# Patient Record
Sex: Female | Born: 1965 | Race: White | Hispanic: No | Marital: Married | Smoking: Never smoker
Health system: Southern US, Community
[De-identification: ages and names within clinical notes are randomized; demographics above are authoritative.]

## PROBLEM LIST (undated history)

## (undated) DIAGNOSIS — E559 Vitamin D deficiency, unspecified: Secondary | ICD-10-CM

## (undated) DIAGNOSIS — M1990 Unspecified osteoarthritis, unspecified site: Secondary | ICD-10-CM

## (undated) HISTORY — DX: Vitamin D deficiency, unspecified: E55.9

## (undated) HISTORY — DX: Unspecified osteoarthritis, unspecified site: M19.90

## (undated) HISTORY — PX: NO PAST SURGERIES: SHX2092

## (undated) NOTE — *Deleted (*Deleted)
Preventive Care 40-64 Years Old, Female Preventive care refers to visits with your health care provider and lifestyle choices that can promote health and wellness. This includes:  A yearly physical exam. This may also be called an annual well check.  Regular dental visits and eye exams.  Immunizations.  Screening for certain conditions.  Healthy lifestyle choices, such as eating a healthy diet, getting regular exercise, not using drugs or products that contain nicotine and tobacco, and limiting alcohol use. What can I expect for my preventive care visit? Physical exam Your health care provider will check your:  Height and weight. This may be used to calculate body mass index (BMI), which tells if you are at a healthy weight.  Heart rate and blood pressure.  Skin for abnormal spots. Counseling Your health care provider may ask you questions about your:  Alcohol, tobacco, and drug use.  Emotional well-being.  Home and relationship well-being.  Sexual activity.  Eating habits.  Work and work environment.  Method of birth control.  Menstrual cycle.  Pregnancy history. What immunizations do I need?  Influenza (flu) vaccine  This is recommended every year. Tetanus, diphtheria, and pertussis (Tdap) vaccine  You may need a Td booster every 10 years. Varicella (chickenpox) vaccine  You may need this if you have not been vaccinated. Zoster (shingles) vaccine  You may need this after age 60. Measles, mumps, and rubella (MMR) vaccine  You may need at least one dose of MMR if you were born in 1957 or later. You may also need a second dose. Pneumococcal conjugate (PCV13) vaccine  You may need this if you have certain conditions and were not previously vaccinated. Pneumococcal polysaccharide (PPSV23) vaccine  You may need one or two doses if you smoke cigarettes or if you have certain conditions. Meningococcal conjugate (MenACWY) vaccine  You may need this if you  have certain conditions. Hepatitis A vaccine  You may need this if you have certain conditions or if you travel or work in places where you may be exposed to hepatitis A. Hepatitis B vaccine  You may need this if you have certain conditions or if you travel or work in places where you may be exposed to hepatitis B. Haemophilus influenzae type b (Hib) vaccine  You may need this if you have certain conditions. Human papillomavirus (HPV) vaccine  If recommended by your health care provider, you may need three doses over 6 months. You may receive vaccines as individual doses or as more than one vaccine together in one shot (combination vaccines). Talk with your health care provider about the risks and benefits of combination vaccines. What tests do I need? Blood tests  Lipid and cholesterol levels. These may be checked every 5 years, or more frequently if you are over 50 years old.  Hepatitis C test.  Hepatitis B test. Screening  Lung cancer screening. You may have this screening every year starting at age 55 if you have a 30-pack-year history of smoking and currently smoke or have quit within the past 15 years.  Colorectal cancer screening. All adults should have this screening starting at age 50 and continuing until age 75. Your health care provider may recommend screening at age 45 if you are at increased risk. You will have tests every 1-10 years, depending on your results and the type of screening test.  Diabetes screening. This is done by checking your blood sugar (glucose) after you have not eaten for a while (fasting). You may have this   done every 1-3 years.  Mammogram. This may be done every 1-2 years. Talk with your health care provider about when you should start having regular mammograms. This may depend on whether you have a family history of breast cancer.  BRCA-related cancer screening. This may be done if you have a family history of breast, ovarian, tubal, or peritoneal  cancers.  Pelvic exam and Pap test. This may be done every 3 years starting at age 21. Starting at age 30, this may be done every 5 years if you have a Pap test in combination with an HPV test. Other tests  Sexually transmitted disease (STD) testing.  Bone density scan. This is done to screen for osteoporosis. You may have this scan if you are at high risk for osteoporosis. Follow these instructions at home: Eating and drinking  Eat a diet that includes fresh fruits and vegetables, whole grains, lean protein, and low-fat dairy.  Take vitamin and mineral supplements as recommended by your health care provider.  Do not drink alcohol if: ? Your health care provider tells you not to drink. ? You are pregnant, may be pregnant, or are planning to become pregnant.  If you drink alcohol: ? Limit how much you have to 0-1 drink a day. ? Be aware of how much alcohol is in your drink. In the [place] one drink equals one 12 oz bottle of beer (355 mL), one 5 oz glass of wine (148 mL), or one 1 oz glass of hard liquor (44 mL). Lifestyle  Take daily care of your teeth and gums.  Stay active. Exercise for at least 30 minutes on 5 or more days each week.  Do not use any products that contain nicotine or tobacco, such as cigarettes, e-cigarettes, and chewing tobacco. If you need help quitting, ask your health care provider.  If you are sexually active, practice safe sex. Use a condom or other form of birth control (contraception) in order to prevent pregnancy and STIs (sexually transmitted infections).  If told by your health care provider, take low-dose aspirin daily starting at age 50. What's next?  Visit your health care provider once a year for a well check visit.  Ask your health care provider how often you should have your eyes and teeth checked.  Stay up to date on all vaccines. This information is not intended to replace advice given to you by your health care provider. Make sure you  discuss any questions you have with your health care provider. Document Revised: 12/05/2017 Document Reviewed: 12/05/2017 Elsevier Patient Education  2020 Elsevier Inc.  

---

## 1998-07-19 ENCOUNTER — Other Ambulatory Visit: Admission: RE | Admit: 1998-07-19 | Discharge: 1998-07-19 | Payer: Self-pay | Admitting: Obstetrics and Gynecology

## 2002-02-11 ENCOUNTER — Inpatient Hospital Stay (HOSPITAL_COMMUNITY): Admission: AD | Admit: 2002-02-11 | Discharge: 2002-02-14 | Payer: Self-pay | Admitting: Obstetrics and Gynecology

## 2006-07-30 ENCOUNTER — Ambulatory Visit: Payer: Self-pay | Admitting: Obstetrics and Gynecology

## 2009-01-19 DIAGNOSIS — R319 Hematuria, unspecified: Secondary | ICD-10-CM | POA: Insufficient documentation

## 2009-05-12 DIAGNOSIS — R42 Dizziness and giddiness: Secondary | ICD-10-CM | POA: Insufficient documentation

## 2009-12-09 ENCOUNTER — Ambulatory Visit: Payer: Self-pay | Admitting: Obstetrics and Gynecology

## 2010-01-23 ENCOUNTER — Ambulatory Visit: Payer: Self-pay | Admitting: Specialist

## 2011-01-29 ENCOUNTER — Ambulatory Visit: Payer: Self-pay | Admitting: Obstetrics and Gynecology

## 2012-02-05 ENCOUNTER — Ambulatory Visit: Payer: Self-pay | Admitting: Obstetrics and Gynecology

## 2013-03-23 ENCOUNTER — Ambulatory Visit: Payer: Self-pay | Admitting: Obstetrics and Gynecology

## 2014-07-19 ENCOUNTER — Emergency Department: Admit: 2014-07-19 | Disposition: A | Discharge status: Home / Self Care | Payer: Self-pay | Admitting: Student

## 2018-05-07 DIAGNOSIS — R768 Other specified abnormal immunological findings in serum: Secondary | ICD-10-CM | POA: Insufficient documentation

## 2018-05-07 DIAGNOSIS — M47816 Spondylosis without myelopathy or radiculopathy, lumbar region: Secondary | ICD-10-CM | POA: Insufficient documentation

## 2019-01-15 DIAGNOSIS — M545 Low back pain, unspecified: Secondary | ICD-10-CM | POA: Insufficient documentation

## 2019-02-12 LAB — VITAMIN D 25 HYDROXY (VIT D DEFICIENCY, FRACTURES): Vit D, 25-Hydroxy: 23

## 2019-02-12 LAB — CBC AND DIFFERENTIAL
HCT: 41 (ref 36–46)
Hemoglobin: 13.6 (ref 12.0–16.0)
Neutrophils Absolute: 2773
Platelets: 394 (ref 150–399)
WBC: 5.9

## 2019-02-12 LAB — LIPID PANEL
Cholesterol: 266 — AB (ref 0–200)
HDL: 74 — AB (ref 35–70)
LDL Cholesterol: 176
LDl/HDL Ratio: 3.6
Triglycerides: 56 (ref 40–160)

## 2019-02-12 LAB — IRON,TIBC AND FERRITIN PANEL
Ferritin: 118
Iron: 99
TIBC: 367

## 2019-02-12 LAB — HEPATIC FUNCTION PANEL
ALT: 26 (ref 7–35)
AST: 20 (ref 13–35)
Alkaline Phosphatase: 66 (ref 25–125)
Bilirubin, Direct: 0.1 (ref 0.01–0.4)
Bilirubin, Total: 0.5

## 2019-02-12 LAB — CBC: RBC: 4.53 (ref 3.87–5.11)

## 2019-02-12 LAB — TSH: TSH: 1.37 (ref 0.41–5.90)

## 2019-02-12 LAB — HEMOGLOBIN A1C: Hemoglobin A1C: 5.4

## 2019-02-12 LAB — BASIC METABOLIC PANEL: Glucose: 92

## 2019-02-12 LAB — COMPREHENSIVE METABOLIC PANEL: Calcium: 10.2 (ref 8.7–10.7)

## 2019-03-11 DIAGNOSIS — M2550 Pain in unspecified joint: Secondary | ICD-10-CM | POA: Insufficient documentation

## 2019-04-17 DIAGNOSIS — G894 Chronic pain syndrome: Secondary | ICD-10-CM | POA: Insufficient documentation

## 2019-08-08 ENCOUNTER — Encounter: Payer: Self-pay | Admitting: Emergency Medicine

## 2019-08-08 ENCOUNTER — Other Ambulatory Visit: Payer: Self-pay

## 2019-08-08 ENCOUNTER — Emergency Department: Payer: 59

## 2019-08-08 DIAGNOSIS — Y999 Unspecified external cause status: Secondary | ICD-10-CM | POA: Insufficient documentation

## 2019-08-08 DIAGNOSIS — W500XXA Accidental hit or strike by another person, initial encounter: Secondary | ICD-10-CM | POA: Insufficient documentation

## 2019-08-08 DIAGNOSIS — Y929 Unspecified place or not applicable: Secondary | ICD-10-CM | POA: Insufficient documentation

## 2019-08-08 DIAGNOSIS — S52614A Nondisplaced fracture of right ulna styloid process, initial encounter for closed fracture: Secondary | ICD-10-CM | POA: Diagnosis not present

## 2019-08-08 DIAGNOSIS — S6991XA Unspecified injury of right wrist, hand and finger(s), initial encounter: Secondary | ICD-10-CM | POA: Diagnosis present

## 2019-08-08 DIAGNOSIS — S52591A Other fractures of lower end of right radius, initial encounter for closed fracture: Secondary | ICD-10-CM | POA: Diagnosis not present

## 2019-08-08 DIAGNOSIS — Y9389 Activity, other specified: Secondary | ICD-10-CM | POA: Diagnosis not present

## 2019-08-08 LAB — FECAL OCCULT BLOOD, GUAIAC: Fecal Occult Blood: NEGATIVE

## 2019-08-08 NOTE — ED Triage Notes (Signed)
Pt arrives POV to triage with c/o right wrist injury about 30 minutes ago. Pt states that her friend fell on her. Pt has noted swelling to right wrist but is otherwise in NAD.

## 2019-08-09 ENCOUNTER — Emergency Department
Admission: EM | Admit: 2019-08-09 | Discharge: 2019-08-09 | Disposition: A | Discharge status: Home / Self Care | Payer: 59 | Attending: Emergency Medicine | Admitting: Emergency Medicine

## 2019-08-09 DIAGNOSIS — S62101A Fracture of unspecified carpal bone, right wrist, initial encounter for closed fracture: Secondary | ICD-10-CM

## 2019-08-09 MED ORDER — ONDANSETRON 4 MG PO TBDP
4.0000 mg | ORAL_TABLET | Freq: Once | ORAL | Status: AC | PRN
Start: 2019-08-09 — End: 2019-08-09

## 2019-08-09 MED ORDER — ONDANSETRON 4 MG PO TBDP
ORAL_TABLET | ORAL | Status: AC
  Administered 2019-08-09: 4 mg via ORAL
  Filled 2019-08-09: qty 1

## 2019-08-09 MED ORDER — OXYCODONE-ACETAMINOPHEN 5-325 MG PO TABS
1.0000 | ORAL_TABLET | Freq: Once | ORAL | Status: AC
  Administered 2019-08-09: 1 via ORAL
  Filled 2019-08-09: qty 1

## 2019-08-09 MED ORDER — OXYCODONE HCL 5 MG PO CAPS: 5.0000 mg | ORAL_CAPSULE | Freq: Four times a day (QID) | ORAL | 0 refills | Status: DC | PRN

## 2019-08-09 NOTE — ED Notes (Signed)
Patient c/o nausea post pain medication administration

## 2019-08-09 NOTE — ED Notes (Signed)
Reviewed discharge instructions, follow-up care, cryotherapy, splint use, elevation, and prescriptions with patient. Patient verbalized understanding of all information reviewed. Patient stable, with no distress noted at this time.

## 2019-08-09 NOTE — Discharge Instructions (Addendum)
Please seek medical attention for any high fevers, chest pain, shortness of breath, change in behavior, persistent vomiting, bloody stool or any other new or concerning symptoms.  

## 2019-08-09 NOTE — ED Provider Notes (Signed)
[facility] Emergency Department Provider Note    ____________________________________________   I have reviewed the triage vital signs and the nursing notes.   HISTORY  Chief Complaint Wrist Pain   History limited by: Not Limited   HPI Brenda Steele is a 54 y.o. female who presents to the emergency department today because of concern for right wrist pain.  The patient states that she was at a party tonight when she and her friend fell.  It sounds like her friend might of fallen more or less on top of her.  She is only complaining of pain to her right wrist.  She denies hitting her head or any loss of consciousness.   Records reviewed. Per medical record review patient has a history of arthritis.  History reviewed. No pertinent past medical history.  There are no problems to display for this patient.   History reviewed. No pertinent surgical history.  Prior to Admission medications   Not on File    Allergies Patient has no known allergies.  No family history on file.  Social History Social History   Tobacco Use  . Smoking status: Never Smoker  . Smokeless tobacco: Never Used  Substance Use Topics  . Alcohol use: Not Currently  . Drug use: Never    Review of Systems Constitutional: No fever/chills Eyes: No visual changes. ENT: No sore throat. Cardiovascular: Denies chest pain. Respiratory: Denies shortness of breath. Gastrointestinal: No abdominal pain.  No nausea, no vomiting.  No diarrhea.   Genitourinary: Negative for dysuria. Musculoskeletal: Positive for right wrist pain. Skin: Negative for rash. Neurological: Negative for headaches, focal weakness or numbness.  ____________________________________________   PHYSICAL EXAM:  VITAL SIGNS: ED Triage Vitals  Enc Vitals Group     BP 08/08/19 2254 (!) 104/48     Pulse Rate 08/08/19 2254 68     Resp 08/08/19 2254 18     Temp 08/08/19 2254 (!) 97.5 F (36.4 C)     Temp  Source 08/08/19 2254 Oral     SpO2 08/08/19 2254 97 %     Weight 08/08/19 2251 150 lb (68 kg)     Height 08/08/19 2251 5\' 6"  (1.676 m)     Head Circumference --      Peak Flow --      Pain Score 08/08/19 2251 10   Constitutional: Alert and oriented.  Eyes: Conjunctivae are normal.  ENT      Head: Normocephalic and atraumatic.      Nose: No congestion/rhinnorhea.      Mouth/Throat: Mucous membranes are moist.      Neck: No stridor. Hematological/Lymphatic/Immunilogical: No cervical lymphadenopathy. Cardiovascular: Normal rate, regular rhythm.  No murmurs, rubs, or gallops.  Respiratory: Normal respiratory effort without tachypnea nor retractions. Breath sounds are clear and equal bilaterally. No wheezes/rales/rhonchi. Gastrointestinal: Soft and non tender. No rebound. No guarding.  Genitourinary: Deferred Musculoskeletal: Deformity and swelling noted to the right wrist. RP and UP 2+. Sensation intact over finger tips. Able to move all fingers.  Neurologic:  Normal speech and language. No gross focal neurologic deficits are appreciated.  Skin:  Skin is warm, dry and intact. No rash noted. Psychiatric: Mood and affect are normal. Speech and behavior are normal. Patient exhibits appropriate insight and judgment.  ____________________________________________    LABS (pertinent positives/negatives)  None  ____________________________________________   EKG  None  ____________________________________________    RADIOLOGY  Right wrist Distal radial fracture and ulnar stylus fracture  ____________________________________________   PROCEDURES  Procedures  POST SPLINT CHECK Right volar wrist splint applied by tech.  Good position.  Distally N/V intact, sensation intact. No discoloration.  ____________________________________________   INITIAL IMPRESSION / ASSESSMENT AND PLAN / ED COURSE  Pertinent labs & imaging results that were available during my care of the  patient were reviewed by me and considered in my medical decision making (see chart for details).   Patient presented to the ER today because of concern for right wrist pain and deformity. X-ray is consistent with fracture. Wrist was splinted. Will discharge to follow up with orthopedics.   ___________________________________________   FINAL CLINICAL IMPRESSION(S) / ED DIAGNOSES  Final diagnoses:  Closed fracture of right wrist, initial encounter     Note: This dictation was prepared with Dragon dictation. Any transcriptional errors that result from this process are unintentional     Nance Pear, MD 08/09/19 [phone]

## 2019-08-10 DIAGNOSIS — S52501A Unspecified fracture of the lower end of right radius, initial encounter for closed fracture: Secondary | ICD-10-CM | POA: Insufficient documentation

## 2019-08-18 ENCOUNTER — Other Ambulatory Visit: Payer: Self-pay

## 2019-08-18 ENCOUNTER — Ambulatory Visit (INDEPENDENT_AMBULATORY_CARE_PROVIDER_SITE_OTHER): Payer: 59 | Admitting: Family Medicine

## 2019-08-18 ENCOUNTER — Encounter: Payer: Self-pay | Admitting: Family Medicine

## 2019-08-18 VITALS — BP 115/81 | HR 80 | Temp 97.5°F | Resp 16 | Ht 67.0 in | Wt 164.0 lb

## 2019-08-18 DIAGNOSIS — E782 Mixed hyperlipidemia: Secondary | ICD-10-CM | POA: Diagnosis not present

## 2019-08-18 DIAGNOSIS — M8949 Other hypertrophic osteoarthropathy, multiple sites: Secondary | ICD-10-CM | POA: Diagnosis not present

## 2019-08-18 DIAGNOSIS — M159 Polyosteoarthritis, unspecified: Secondary | ICD-10-CM

## 2019-08-18 DIAGNOSIS — H8110 Benign paroxysmal vertigo, unspecified ear: Secondary | ICD-10-CM

## 2019-08-18 DIAGNOSIS — E559 Vitamin D deficiency, unspecified: Secondary | ICD-10-CM

## 2019-08-18 NOTE — Progress Notes (Signed)
New patient visit   Patient: Brenda Steele   DOB: 1965/05/23   54 y.o. Female  MRN: [id number] Visit Date: 08/18/2019  I,Sulibeya S Dimas,acting as a scribe for [name], MD.,have documented all relevant documentation on the behalf of [name], MD,as directed by  [name], MD while in the presence of [name], MD.  Today's healthcare provider: [name], MD   Chief Complaint  Patient presents with  . New Patient (Initial Visit)   Subjective    Brenda Steele is a 54 y.o. female who presents today as a new patient to establish care.  HPI  Patient reports she has not had a PCP in several years. Patient does go to [facility]. Patient reports pap is up to date, done February 8th along with mammogram.   R wrist fracture on 5/1.  Followed by Ortho. Non-surgical  Over last few weeks Room spinning when gets up in the morning No N/V Feels unsteady with certain movements  Past Medical History:  Diagnosis Date  . Arthritis   . Avitaminosis D    Past Surgical History:  Procedure Laterality Date  . NO PAST SURGERIES     Family Status  Relation Name Status  . Mother  Alive  . Father  Deceased  . Sister  Alive  . Daughter  Alive  . Sister  Alive  . Daughter  Alive   Family History  Problem Relation Age of Onset  . Diabetes Mother   . Thrombophilia Father   . Hypertension Sister   . Hyperlipidemia Sister   . Healthy Daughter   . Ulcerative colitis Daughter        proctitis   Social History   Socioeconomic History  . Marital status: Married    Spouse name: Not on file  . Number of children: 2  . Years of education: Not on file  . Highest education level: Not on file  Occupational History  . Occupation: Electrical engineer  Tobacco Use  . Smoking status: Never Smoker  . Smokeless tobacco: Never Used  Substance and Sexual Activity  . Alcohol use: Yes    Alcohol/week: 2.0 standard drinks    Types: 2 Glasses of wine per  week  . Drug use: Never  . Sexual activity: Yes    Partners: Male    Birth control/protection: Post-menopausal  Other Topics Concern  . Not on file  Social History Narrative  . Not on file   Social Determinants of Health   Financial Resource Strain:   . Difficulty of Paying Living Expenses:   Food Insecurity:   . Worried About Charity fundraiser in the Last Year:   . Arboriculturist in the Last Year:   Transportation Needs:   . Film/video editor (Medical):   Marland Kitchen Lack of Transportation (Non-Medical):   Physical Activity:   . Days of Exercise per Week:   . Minutes of Exercise per Session:   Stress:   . Feeling of Stress :   Social Connections:   . Frequency of Communication with Friends and Family:   . Frequency of Social Gatherings with Friends and Family:   . Attends Religious Services:   . Active Member of Clubs or Organizations:   . Attends Archivist Meetings:   Marland Kitchen Marital Status:    Outpatient Medications Prior to Visit  Medication Sig  . cholecalciferol (VITAMIN D3) 25 MCG (1000 UNIT) tablet Take 1,000 Units by mouth daily.  Marland Kitchen  naproxen (NAPROSYN) 500 MG tablet naproxen 500 mg tablet  Take 1 tablet twice a day by oral route.  . phentermine (ADIPEX-P) 37.5 MG tablet Take 1 tablet by mouth daily.  . [DISCONTINUED] oxycodone (OXY-IR) 5 MG capsule Take 1 capsule (5 mg total) by mouth every 6 (six) hours as needed for up to 20 doses.   No facility-administered medications prior to visit.   No Known Allergies  Immunization History  Administered Date(s) Administered  . Influenza Split 01/19/2009  . Tdap 01/19/2009    Health Maintenance  Topic Date Due  . HIV Screening  Never done  . PAP SMEAR-Modifier  Never done  . MAMMOGRAM  Never done  . COLONOSCOPY  Never done  . TETANUS/TDAP  01/20/2019  . INFLUENZA VACCINE  11/08/2019    Patient Care Team: Virginia Crews, MD as PCP - General (Family Medicine)  Review of Systems  Constitutional:  Negative.   HENT: Negative.   Eyes: Negative.   Respiratory: Negative.   Cardiovascular: Negative.   Gastrointestinal: Negative.   Endocrine: Negative.   Genitourinary: Negative.   Musculoskeletal: Positive for arthralgias and joint swelling. Negative for back pain, gait problem, myalgias, neck pain and neck stiffness.  Skin: Negative.   Allergic/Immunologic: Negative.   Neurological: Positive for dizziness. Negative for tremors, seizures, syncope, facial asymmetry, speech difficulty, weakness, light-headedness, numbness and headaches.  Hematological: Negative.   Psychiatric/Behavioral: Negative.       Objective    BP 115/81 (BP Location: Left Arm, Patient Position: Sitting, Cuff Size: Large)   Pulse 80   Temp (!) 97.5 F (36.4 C) (Temporal)   Resp 16   Ht 5\' 7"  (1.702 m)   Wt 164 lb (74.4 kg)   BMI 25.69 kg/m  Physical Exam Vitals reviewed.  Constitutional:      General: She is not in acute distress.    Appearance: Normal appearance. She is well-developed. She is not diaphoretic.  HENT:     Head: Normocephalic and atraumatic.     Right Ear: Tympanic membrane, ear canal and external ear normal.     Left Ear: Tympanic membrane, ear canal and external ear normal.     Nose: Nose normal.     Mouth/Throat:     Mouth: Mucous membranes are moist.     Pharynx: Oropharynx is clear. No oropharyngeal exudate.  Eyes:     General: No scleral icterus.    Conjunctiva/sclera: Conjunctivae normal.     Pupils: Pupils are equal, round, and reactive to light.  Neck:     Thyroid: No thyromegaly.  Cardiovascular:     Rate and Rhythm: Normal rate and regular rhythm.     Heart sounds: Normal heart sounds. No murmur.  Pulmonary:     Effort: Pulmonary effort is normal. No respiratory distress.     Breath sounds: Normal breath sounds. No wheezing, rhonchi or rales.  Abdominal:     General: There is no distension.     Palpations: Abdomen is soft.     Tenderness: There is no abdominal  tenderness. There is no guarding or rebound.  Musculoskeletal:        General: No deformity.     Cervical back: Neck supple.     Right lower leg: No edema.     Left lower leg: No edema.  Lymphadenopathy:     Cervical: No cervical adenopathy.  Skin:    General: Skin is warm and dry.     Findings: No rash.  Neurological:  Mental Status: She is alert and oriented to person, place, and time. Mental status is at baseline.     Gait: Gait normal.  Psychiatric:        Mood and Affect: Mood normal.        Behavior: Behavior normal.        Thought Content: Thought content normal.     Depression Screen PHQ 2/9 Scores 08/18/2019  PHQ - 2 Score 0  PHQ- 9 Score 0   No results found for any visits on 08/18/19.  Assessment & Plan      Problem List Items Addressed This Visit      Nervous and Auditory   Benign paroxysmal positional vertigo - Primary    Symptoms consistent with BPPV Discussed flonase for decongestant No nausea Could consider meclizine in the future Epley maneuver discussed Return precautions discussed        Musculoskeletal and Integument   Primary osteoarthritis involving multiple joints    Followed by Ortho States that she has been worked up for Rheum conditions previously and was negative      Relevant Medications   naproxen (NAPROSYN) 500 MG tablet     Other   Avitaminosis D    Reviewed last labs Continue supplement      Mixed hyperlipidemia    ASCVD 10 yr risk 1.5% Discussed this with the patient Recommend lifestyle changes No medications at this time  Continue to monitor          Reviewed health screening labs from [facility] from 02/2019.  Will abstract to chart.  Patient to send records of colon cancer screening done last year.  ROI sent to GYN for pap and mammograms   Return in about 6 months (around 02/18/2020) for CPE.     I, [name], MD, have reviewed all documentation for this visit. The documentation on 08/18/19 for the  exam, diagnosis, procedures, and orders are all accurate and complete.   Adrianne Shackleton, Dionne Bucy, MD, MPH [facility]

## 2019-08-18 NOTE — Patient Instructions (Signed)

## 2019-08-18 NOTE — Assessment & Plan Note (Signed)
Followed by Ortho States that she has been worked up for Rheum conditions previously and was negative

## 2019-08-18 NOTE — Assessment & Plan Note (Signed)
ASCVD 10 yr risk 1.5% Discussed this with the patient Recommend lifestyle changes No medications at this time  Continue to monitor

## 2019-08-18 NOTE — Assessment & Plan Note (Signed)
Symptoms consistent with BPPV Discussed flonase for decongestant No nausea Could consider meclizine in the future Epley maneuver discussed Return precautions discussed

## 2019-08-18 NOTE — Assessment & Plan Note (Signed)
Reviewed last labs Continue supplement

## 2019-08-20 ENCOUNTER — Encounter: Payer: Self-pay | Admitting: Family Medicine

## 2019-08-28 ENCOUNTER — Encounter: Payer: Self-pay | Admitting: Family Medicine

## 2019-09-08 ENCOUNTER — Encounter: Payer: Self-pay | Admitting: Family Medicine

## 2019-09-08 NOTE — Telephone Encounter (Signed)
Have not discussed phentermine with the patient.  Would have to have visit to discuss prior to considering prescribing.

## 2019-09-10 NOTE — Progress Notes (Signed)
MyChart Video Visit    Virtual Visit via Video Note   This visit type was conducted due to national recommendations for restrictions regarding the COVID-19 Pandemic (e.g. social distancing) in an effort to limit this patient's exposure and mitigate transmission in our community. This patient is at least at moderate risk for complications without adequate follow up. This format is felt to be most appropriate for this patient at this time. Physical exam was limited by quality of the video and audio technology used for the visit.    Patient location: home Provider location: [facility] Persons involved in the visit: patient, provider   Patient: Brenda Steele   DOB: 06-Oct-1965   54 y.o. Female  MRN: [id number] Visit Date: 09/11/2019  Today's healthcare provider: [name], MD   Chief Complaint  Patient presents with  . Medication Management   Subjective    HPI  Patient presents today to discuss phentamine medication.   Dr Ronita Hipps has been prescribing phentermine for several years  She has previously taken breaks off the phentermine  She finds it helpful to control appetite and avoid weight gain.    Medications: Outpatient Medications Prior to Visit  Medication Sig  . cholecalciferol (VITAMIN D3) 25 MCG (1000 UNIT) tablet Take 1,000 Units by mouth daily.  . naproxen (NAPROSYN) 500 MG tablet naproxen 500 mg tablet  Take 1 tablet twice a day by oral route.  . phentermine (ADIPEX-P) 37.5 MG tablet Take 1 tablet by mouth daily.   No facility-administered medications prior to visit.    Review of Systems  Constitutional: Negative.   Respiratory: Negative.   Cardiovascular: Negative.   Neurological: Negative.   Psychiatric/Behavioral: Negative.       Objective    There were no vitals taken for this visit. BP Readings from Last 3 Encounters:  08/18/19 115/81  08/09/19 102/70   Wt Readings from Last 3 Encounters:  08/18/19 164 lb (74.4 kg)    08/08/19 150 lb (68 kg)      Physical Exam Constitutional:      General: She is not in acute distress.    Appearance: Normal appearance.  HENT:     Head: Normocephalic and atraumatic.  Pulmonary:     Effort: Pulmonary effort is normal. No respiratory distress.  Neurological:     Mental Status: She is alert and oriented to person, place, and time. Mental status is at baseline.  Psychiatric:        Mood and Affect: Mood normal.        Behavior: Behavior normal.        Assessment & Plan     Problem List Items Addressed This Visit      Other   Overweight - Primary    Discussed importance of healthy weight management Discussed diet and exercise  Discussed risks and benefits of phentermine Discussed using for 3 months before taking a break  Will Rx 3 month supply today Reviewed controlled substance database Monitor weight and BP F/u in 3 months Return precautions discussed          No follow-ups on file.     I discussed the assessment and treatment plan with the patient. The patient was provided an opportunity to ask questions and all were answered. The patient agreed with the plan and demonstrated an understanding of the instructions.   The patient was advised to call back or seek an in-person evaluation if the symptoms worsen or if the condition fails to improve  as anticipated.  I, [name], MD, have reviewed all documentation for this visit. The documentation on 09/11/19 for the exam, diagnosis, procedures, and orders are all accurate and complete.   Brenda Steele, Dionne Bucy, MD, MPH [facility]

## 2019-09-11 ENCOUNTER — Encounter: Payer: Self-pay | Admitting: Family Medicine

## 2019-09-11 ENCOUNTER — Telehealth (INDEPENDENT_AMBULATORY_CARE_PROVIDER_SITE_OTHER): Payer: 59 | Admitting: Family Medicine

## 2019-09-11 DIAGNOSIS — E663 Overweight: Secondary | ICD-10-CM | POA: Insufficient documentation

## 2019-09-11 MED ORDER — PHENTERMINE HCL 37.5 MG PO TABS: 37.5000 mg | ORAL_TABLET | Freq: Every day | ORAL | 2 refills | Status: DC

## 2019-09-11 NOTE — Patient Instructions (Signed)
Phentermine tablets or capsules What is this medicine? PHENTERMINE (FEN ter meen) decreases your appetite. It is used with a reduced calorie diet and exercise to help you lose weight. This medicine may be used for other purposes; ask your health care provider or pharmacist if you have questions. COMMON BRAND NAME(S): Adipex-P, Atti-Plex P, Atti-Plex P Spansule, Fastin, Lomaira, Pro-Fast, Tara-8 What should I tell my health care provider before I take this medicine? They need to know if you have any of these conditions:  agitation or nervousness  diabetes  glaucoma  heart disease  high blood pressure  history of drug abuse or addiction  history of stroke  kidney disease  lung disease called Primary Pulmonary Hypertension (PPH)  taken an MAOI like Carbex, Eldepryl, Marplan, Nardil, or Parnate in last 14 days  taking stimulant medicines for attention disorders, weight loss, or to stay awake  thyroid disease  an unusual or allergic reaction to phentermine, other medicines, foods, dyes, or preservatives  pregnant or trying to get pregnant  breast-feeding How should I use this medicine? Take this medicine by mouth with a glass of water. Follow the directions on the prescription label. Take your medicine at regular intervals. Do not take it more often than directed. Do not stop taking except on your doctor's advice. Talk to your pediatrician regarding the use of this medicine in children. While this drug may be prescribed for children 17 years or older for selected conditions, precautions do apply. Overdosage: If you think you have taken too much of this medicine contact a poison control center or emergency room at once. NOTE: This medicine is only for you. Do not share this medicine with others. What if I miss a dose? If you miss a dose, take it as soon as you can. If it is almost time for your next dose, take only that dose. Do not take double or extra doses. What may interact  with this medicine? Do not take this medicine with any of the following medications:  MAOIs like Carbex, Eldepryl, Marplan, Nardil, and Parnate This medicine may also interact with the following medications:  alcohol  certain medicines for depression, anxiety, or psychotic disorders  certain medicines for high blood pressure  linezolid  medicines for colds or breathing difficulties like pseudoephedrine or phenylephrine  medicines for diabetes  sibutramine  stimulant medicines for attention disorders, weight loss, or to stay awake This list may not describe all possible interactions. Give your health care provider a list of all the medicines, herbs, non-prescription drugs, or dietary supplements you use. Also tell them if you smoke, drink alcohol, or use illegal drugs. Some items may interact with your medicine. What should I watch for while using this medicine? Visit your doctor or health care provider for regular checks on your progress. Do not stop taking except on your health care provider's advice. You may develop a severe reaction. Your health care provider will tell you how much medicine to take. Do not take this medicine close to bedtime. It may prevent you from sleeping. You may get drowsy or dizzy. Do not drive, use machinery, or do anything that needs mental alertness until you know how this medicine affects you. Do not stand or sit up quickly, especially if you are an older patient. This reduces the risk of dizzy or fainting spells. Alcohol may increase dizziness and drowsiness. Avoid alcoholic drinks. This medicine may affect blood sugar levels. Ask your healthcare provider if changes in diet or medicines are needed  if you have diabetes. Women should inform their health care provider if they wish to become pregnant or think they might be pregnant. Losing weight while pregnant is not advised and may cause harm to the unborn child. Talk to your health care provider for more  information. What side effects may I notice from receiving this medicine? Side effects that you should report to your doctor or health care professional as soon as possible:  allergic reactions like skin rash, itching or hives, swelling of the face, lips, or tongue  breathing problems  changes in emotions or moods  changes in vision  chest pain or chest tightness  fast, irregular heartbeat  feeling faint or lightheaded  increased blood pressure  irritable  restlessness  tremors  seizures  signs and symptoms of a stroke like changes in vision; confusion; trouble speaking or understanding; severe headaches; sudden numbness or weakness of the face, arm or leg; trouble walking; dizziness; loss of balance or coordination  unusually weak or tired Side effects that usually do not require medical attention (report to your doctor or health care professional if they continue or are bothersome):  changes in taste  constipation or diarrhea  dizziness  dry mouth  headache  trouble sleeping  upset stomach This list may not describe all possible side effects. Call your doctor for medical advice about side effects. You may report side effects to FDA at 1-800-FDA-1088. Where should I keep my medicine? Keep out of the reach of children. This medicine can be abused. Keep your medicine in a safe place to protect it from theft. Do not share this medicine with anyone. Selling or giving away this medicine is dangerous and against the law. This medicine may cause harm and death if it is taken by other adults, children, or pets. Return medicine that has not been used to an official disposal site. Contact the DEA at 1-800-882-9539 or your city/county government to find a site. If you cannot return the medicine, mix any unused medicine with a substance like cat litter or coffee grounds. Then throw the medicine away in a sealed container like a sealed bag or coffee can with a lid. Do not use the  medicine after the expiration date. Store at room temperature between 20 and 25 degrees C (68 and 77 degrees F). Keep container tightly closed. NOTE: This sheet is a summary. It may not cover all possible information. If you have questions about this medicine, talk to your doctor, pharmacist, or health care provider.  2020 Elsevier/Gold Standard (2019-01-30 12:54:20)  

## 2019-09-11 NOTE — Assessment & Plan Note (Signed)
Discussed importance of healthy weight management Discussed diet and exercise  Discussed risks and benefits of phentermine Discussed using for 3 months before taking a break  Will Rx 3 month supply today Reviewed controlled substance database Monitor weight and BP F/u in 3 months Return precautions discussed

## 2019-11-18 ENCOUNTER — Encounter: Payer: Self-pay | Admitting: Family Medicine

## 2019-12-09 ENCOUNTER — Ambulatory Visit: Payer: Self-pay | Admitting: Family Medicine

## 2019-12-28 ENCOUNTER — Encounter: Payer: Self-pay | Admitting: Family Medicine

## 2019-12-28 NOTE — Telephone Encounter (Signed)
Can we find a new patient appointment for her?

## 2019-12-31 ENCOUNTER — Ambulatory Visit (INDEPENDENT_AMBULATORY_CARE_PROVIDER_SITE_OTHER): Payer: 59 | Admitting: Family Medicine

## 2019-12-31 ENCOUNTER — Encounter: Payer: Self-pay | Admitting: Family Medicine

## 2019-12-31 ENCOUNTER — Other Ambulatory Visit: Payer: Self-pay

## 2019-12-31 VITALS — BP 126/82 | HR 79 | Temp 98.2°F | Wt 157.0 lb

## 2019-12-31 DIAGNOSIS — E663 Overweight: Secondary | ICD-10-CM

## 2019-12-31 DIAGNOSIS — Z23 Encounter for immunization: Secondary | ICD-10-CM

## 2019-12-31 NOTE — Assessment & Plan Note (Signed)
Now at a healthy weight Discussed importance of healthy weight management Discussed diet and exercise Set exercise goals: walk 3-4 days/week, weights 1-2 days/week Taking a break on phentermine Monitor weight and BP F/u in 3 months at CPE

## 2019-12-31 NOTE — Progress Notes (Signed)
Established patient visit   Patient: Brenda Steele   DOB: [birth year]   54 y.o. Female  MRN: [id number] Visit Date: 12/31/2019  Today's healthcare provider: [name], MD   Chief Complaint  Patient presents with  . Follow-up   Subjective    HPI  Follow up for weight  Ms. Gibb has been on phentermine for 3 months. She has been on it previously as well. She has lost 7lbs since May and her BMI is in a healthy zone. She reports that she has been doing well and likes the phentermine as it gives her energy.   She is active in her daily life but doesn't specifically exercise. Her goals are to walk 3-4 days a week with her friends and do weights Tuesdays and Sundays (if possible) to get more toned.  The patient was last seen for this 3 months ago. Changes made at last visit include starting phentermine.  She reports excellent compliance with treatment. She feels that condition is Improved. She is not having side effects.   -----------------------------------------------------------------------------------------  Medications: Outpatient Medications Prior to Visit  Medication Sig  . cholecalciferol (VITAMIN D3) 25 MCG (1000 UNIT) tablet Take 1,000 Units by mouth daily.  . phentermine (ADIPEX-P) 37.5 MG tablet Take 1 tablet (37.5 mg total) by mouth daily.  . naproxen (NAPROSYN) 500 MG tablet naproxen 500 mg tablet  Take 1 tablet twice a day by oral route.   No facility-administered medications prior to visit.    Review of Systems  Constitutional: Negative.   Respiratory: Negative.   Cardiovascular: Negative.   Gastrointestinal: Negative.   Neurological: Negative for dizziness, light-headedness and headaches.      Objective    BP 126/82 (BP Location: Right Arm, Patient Position: Sitting, Cuff Size: Large)   Pulse 79   Temp 98.2 F (36.8 C) (Oral)   Wt 157 lb (71.2 kg)   SpO2 98%   BMI 24.59 kg/m    Physical Exam Constitutional:      General: She is  not in acute distress.    Appearance: She is normal weight. She is not ill-appearing, toxic-appearing or diaphoretic.  HENT:     Head: Normocephalic.     Right Ear: External ear normal.     Left Ear: External ear normal.  Cardiovascular:     Rate and Rhythm: Normal rate and regular rhythm.     Heart sounds: Normal heart sounds. No murmur heard.  No friction rub. No gallop.   Pulmonary:     Effort: Pulmonary effort is normal. No respiratory distress.     Breath sounds: Normal breath sounds. No stridor. No wheezing.  Abdominal:     Palpations: Abdomen is soft.  Musculoskeletal:     Right wrist: Swelling present.     Left wrist: Normal.     Cervical back: Normal range of motion and neck supple.  Skin:    General: Skin is warm and dry.  Neurological:     General: No focal deficit present.     Mental Status: She is alert. Mental status is at baseline.  Psychiatric:        Mood and Affect: Mood normal.        Behavior: Behavior normal.        Thought Content: Thought content normal.        Judgment: Judgment normal.     No results found for any visits on 12/31/19.  Assessment & Plan     Problem List Items Addressed  This Visit      Other   Overweight - Primary    Now at a healthy weight Discussed importance of healthy weight management Discussed diet and exercise Set exercise goals: walk 3-4 days/week, weights 1-2 days/week Taking a break on phentermine Monitor weight and BP F/u in 3 months at CPE       Other Visit Diagnoses    Need for influenza vaccination       Relevant Orders   Flu Vaccine QUAD 6+ mos PF IM (Fluarix Quad PF)   Need for shingles vaccine       Relevant Orders   Varicella-zoster vaccine IM (Shingrix)      Return in about 3 months (around 03/31/2020) for CPE.      Rodrigo Ran, MS3   Patient seen along with MS3 student Rodrigo Ran. I personally evaluated this patient along with the student, and verified all aspects of the history,  physical exam, and medical decision making as documented by the student. I agree with the student's documentation and have made all necessary edits.  Alroy Portela, Dionne Bucy, MD, MPH [facility]

## 2020-01-05 ENCOUNTER — Encounter: Payer: Self-pay | Admitting: Family Medicine

## 2020-02-19 ENCOUNTER — Encounter: Payer: Self-pay | Admitting: Family Medicine

## 2020-02-22 ENCOUNTER — Ambulatory Visit: Payer: 59 | Admitting: Family Medicine

## 2020-02-22 NOTE — Progress Notes (Deleted)
Complete physical exam   Patient: Brenda Steele   DOB: [birth year]   54 y.o. Female  MRN: [id number] Visit Date: 02/22/2020  Today's healthcare provider: [name], MD   No chief complaint on file.  Subjective    Brenda Steele is a 54 y.o. female who presents today for a complete physical exam.  She reports consuming a {diet types:17450} diet. {Exercise:19826} She generally feels {well/fairly well/poorly:18703}. She reports sleeping {well/fairly well/poorly:18703}. She {does/does not:200015} have additional problems to discuss today.  HPI  05/01/2021Fecal Occult Blood-negative 03/23/2013 Mammogram-BI-RADS 1  Past Medical History:  Diagnosis Date  . Arthritis   . Avitaminosis D    Past Surgical History:  Procedure Laterality Date  . NO PAST SURGERIES     Social History   Socioeconomic History  . Marital status: Married    Spouse name: Not on file  . Number of children: 2  . Years of education: Not on file  . Highest education level: Not on file  Occupational History  . Occupation: Electrical engineer  Tobacco Use  . Smoking status: Never Smoker  . Smokeless tobacco: Never Used  Vaping Use  . Vaping Use: Never used  Substance and Sexual Activity  . Alcohol use: Yes    Alcohol/week: 2.0 standard drinks    Types: 2 Glasses of wine per week  . Drug use: Never  . Sexual activity: Yes    Partners: Male    Birth control/protection: Post-menopausal  Other Topics Concern  . Not on file  Social History Narrative  . Not on file   Social Determinants of Health   Financial Resource Strain:   . Difficulty of Paying Living Expenses: Not on file  Food Insecurity:   . Worried About Charity fundraiser in the Last Year: Not on file  . Ran Out of Food in the Last Year: Not on file  Transportation Needs:   . Lack of Transportation (Medical): Not on file  . Lack of Transportation (Non-Medical): Not on file  Physical Activity:   . Days of Exercise per Week: Not on  file  . Minutes of Exercise per Session: Not on file  Stress:   . Feeling of Stress : Not on file  Social Connections:   . Frequency of Communication with Friends and Family: Not on file  . Frequency of Social Gatherings with Friends and Family: Not on file  . Attends Religious Services: Not on file  . Active Member of Clubs or Organizations: Not on file  . Attends Archivist Meetings: Not on file  . Marital Status: Not on file  Intimate Partner Violence:   . Fear of Current or Ex-Partner: Not on file  . Emotionally Abused: Not on file  . Physically Abused: Not on file  . Sexually Abused: Not on file   Family Status  Relation Name Status  . Mother  Alive  . Father  Deceased  . Sister  Alive  . Daughter  Alive  . Sister  Alive  . Daughter  Alive   Family History  Problem Relation Age of Onset  . Diabetes Mother   . Thrombophilia Father   . Hypertension Sister   . Hyperlipidemia Sister   . Healthy Daughter   . Ulcerative colitis Daughter        proctitis   No Known Allergies  Patient Care Team: Virginia Crews, MD as PCP - General (Family Medicine)   Medications: Outpatient Medications Prior to Visit  Medication Sig  . cholecalciferol (VITAMIN D3) 25 MCG (1000 UNIT) tablet Take 1,000 Units by mouth daily.   No facility-administered medications prior to visit.    Review of Systems  Constitutional: Negative.   HENT: Negative.   Eyes: Negative.   Respiratory: Negative.   Cardiovascular: Negative.   Gastrointestinal: Negative.   Endocrine: Negative.   Genitourinary: Negative.   Musculoskeletal: Negative.   Skin: Negative.   Allergic/Immunologic: Negative.   Neurological: Negative.   Hematological: Negative.   Psychiatric/Behavioral: Negative.     {Heme  Chem  Endocrine  Serology  Results Review (optional):23779::" "}  Objective    There were no vitals taken for this visit. {Show previous vital signs (optional):23777::" "}  Physical  Exam  ***  Last depression screening scores PHQ 2/9 Scores 01/01/2020 08/18/2019  PHQ - 2 Score 0 0  PHQ- 9 Score 0 0   Last fall risk screening Fall Risk  01/01/2020  Falls in the past year? 0  Number falls in past yr: 0  Injury with Fall? 0  Risk for fall due to : No Fall Risks  Follow up Falls evaluation completed   Last Audit-C alcohol use screening Alcohol Use Disorder Test (AUDIT) 01/01/2020  1. How often do you have a drink containing alcohol? 2  2. How many drinks containing alcohol do you have on a typical day when you are drinking? 0  3. How often do you have six or more drinks on one occasion? 0  AUDIT-C Score 2  Alcohol Brief Interventions/Follow-up AUDIT Score <7 follow-up not indicated   A score of 3 or more in women, and 4 or more in men indicates increased risk for alcohol abuse, EXCEPT if all of the points are from question 1   No results found for any visits on 02/22/20.  Assessment & Plan    Routine Health Maintenance and Physical Exam  Exercise Activities and Dietary recommendations Goals   None     Immunization History  Administered Date(s) Administered  . Influenza Split 01/19/2009  . Influenza,inj,Quad PF,6+ Mos 12/31/2019  . PFIZER SARS-COV-2 Vaccination 06/25/2019, 07/16/2019  . Tdap 01/19/2009  . Zoster Recombinat (Shingrix) 12/31/2019    Health Maintenance  Topic Date Due  . Hepatitis C Screening  Never done  . HIV Screening  Never done  . PAP SMEAR-Modifier  Never done  . MAMMOGRAM  Never done  . COLONOSCOPY  Never done  . TETANUS/TDAP  01/20/2019  . COLON CANCER SCREENING ANNUAL FOBT  08/07/2020  . INFLUENZA VACCINE  Completed  . COVID-19 Vaccine  Completed    Discussed health benefits of physical activity, and encouraged her to engage in regular exercise appropriate for her age and condition.  ***  No follow-ups on file.     {provider attestation***:1}   [name], MD  [facility] [phone]  (phone) [phone] (fax)  [place]

## 2020-10-11 LAB — RESULTS CONSOLE HPV: CHL HPV: NEGATIVE

## 2020-10-14 LAB — HM PAP SMEAR
HM Pap smear: NEGATIVE
HPV, high-risk: NEGATIVE

## 2021-02-09 LAB — HM MAMMOGRAPHY

## 2021-07-19 ENCOUNTER — Ambulatory Visit (INDEPENDENT_AMBULATORY_CARE_PROVIDER_SITE_OTHER): Payer: 59

## 2021-07-19 ENCOUNTER — Encounter: Payer: Self-pay | Admitting: Podiatry

## 2021-07-19 ENCOUNTER — Ambulatory Visit (INDEPENDENT_AMBULATORY_CARE_PROVIDER_SITE_OTHER): Payer: 59 | Admitting: Podiatry

## 2021-07-19 DIAGNOSIS — M2042 Other hammer toe(s) (acquired), left foot: Secondary | ICD-10-CM | POA: Diagnosis not present

## 2021-07-19 DIAGNOSIS — L309 Dermatitis, unspecified: Secondary | ICD-10-CM | POA: Insufficient documentation

## 2021-07-19 DIAGNOSIS — N912 Amenorrhea, unspecified: Secondary | ICD-10-CM | POA: Insufficient documentation

## 2021-07-19 DIAGNOSIS — D2372 Other benign neoplasm of skin of left lower limb, including hip: Secondary | ICD-10-CM | POA: Diagnosis not present

## 2021-07-19 DIAGNOSIS — D2371 Other benign neoplasm of skin of right lower limb, including hip: Secondary | ICD-10-CM | POA: Diagnosis not present

## 2021-07-19 DIAGNOSIS — IMO0002 Reserved for concepts with insufficient information to code with codable children: Secondary | ICD-10-CM | POA: Insufficient documentation

## 2021-07-19 DIAGNOSIS — Z7689 Persons encountering health services in other specified circumstances: Secondary | ICD-10-CM | POA: Insufficient documentation

## 2021-07-19 DIAGNOSIS — M2041 Other hammer toe(s) (acquired), right foot: Secondary | ICD-10-CM

## 2021-07-19 NOTE — Progress Notes (Signed)
?  Subjective:  ?Patient ID: Brenda Steele, female    DOB: 1966/01/26,  MRN: [id number] ?HPI ?Chief Complaint  ?Patient presents with  ? Toe Pain  ?  4th toe left (lateral) - corn x several months, 5th toe rubs, bunion deformity, feet ache in general, corn also on 5th toe right  ? Nail Problem  ?  Toenails bilateral - discoloration  ? New Patient (Initial Visit)  ? ? ?56 y.o. female presents with the above complaint.  ? ?ROS: Denies fever chills nausea vomiting muscle aches pains calf pain back pain chest pain shortness of breath. ? ?Past Medical History:  ?Diagnosis Date  ? Arthritis   ? Avitaminosis D   ? ?Past Surgical History:  ?Procedure Laterality Date  ? NO PAST SURGERIES    ? ? ?Current Outpatient Medications:  ?  cholecalciferol (VITAMIN D3) 25 MCG (1000 UNIT) tablet, Take 1,000 Units by mouth daily., Disp: , Rfl:  ?  clobetasol ointment (TEMOVATE) 0.05 %, Apply topically 2 (two) times daily., Disp: , Rfl:  ?  phentermine (ADIPEX-P) 37.5 MG tablet, Take 37.5 mg by mouth every morning., Disp: , Rfl:  ? ?No Known Allergies ?Review of Systems ?Objective:  ?There were no vitals filed for this visit. ? ?General: Well developed, nourished, in no acute distress, alert and oriented x3  ? ?Dermatological: Skin is warm, dry and supple bilateral. Nails x 10 are well maintained; remaining integument appears unremarkable at this time. There are no open sores, no preulcerative lesions, no rash or signs of infection present.  Reactive benign skin lesion to the lateral aspect of the PIPJ fourth digit left and the dorsal lateral aspect of the PIPJ fifth digit right foot.  Nails are thin and brittle.  She has fissures along the first metatarsal phalangeal joint bilateral right greater than left. ? ?Vascular: Dorsalis Pedis artery and Posterior Tibial artery pedal pulses are 2/4 bilateral with immedate capillary fill time. Pedal hair growth present. No varicosities and no lower extremity edema present bilateral.  ? ?Neruologic:  Grossly intact via light touch bilateral. Vibratory intact via tuning fork bilateral. Protective threshold with Semmes Wienstein monofilament intact to all pedal sites bilateral. Patellar and Achilles deep tendon reflexes 2+ bilateral. No Babinski or clonus noted bilateral.  ? ?Musculoskeletal: No gross boney pedal deformities bilateral. No pain, crepitus, or limitation noted with foot and ankle range of motion bilateral. Muscular strength 5/5 in all groups tested bilateral.  Mild hammertoe deformity second right resulting in clavus.  Mild hallux valgus bilateral with spurring left greater than right mild hallux limitus left.  Adductovarus rotated hammertoe deformity left resulting in benign skin lesion. ? ?Gait: Unassisted, Nonantalgic.  ? ? ?Radiographs: ?Radiographs taken today demonstrate osseously mature individual with mild hallux valgus and early osteoarthritic changes dorsal spurring first metatarsophalangeal joint left foot only adductovarus rotated hammertoe deformity fifth bilateral.  Early osteoarthritic changes DIPJ third left ?Assessment & Plan:  ? ?Assessment: She has benign hyperkeratotic skin lesions bilateral most likely due to inappropriate shoe gear.  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  Fissures to the forefoot noncomplicated ? ?Plan: Debrided benign skin lesion.  Discussed options for shoes.  She will follow-up with [place] as needed ? ? ? ? ?Joycelin Radloff T. [place], DPM ?

## 2021-07-28 LAB — FECAL OCCULT BLOOD, GUAIAC: Fecal Occult Blood: NEGATIVE

## 2021-08-20 IMAGING — CR DG WRIST COMPLETE 3+V*R*
4 series · 4 of 4 positions shown · non-contrast
Comparison: None.

CLINICAL DATA: Fall, right wrist injury 30 minutes prior

EXAM:
RIGHT WRIST - COMPLETE 3+ VIEW

[wrist pa]
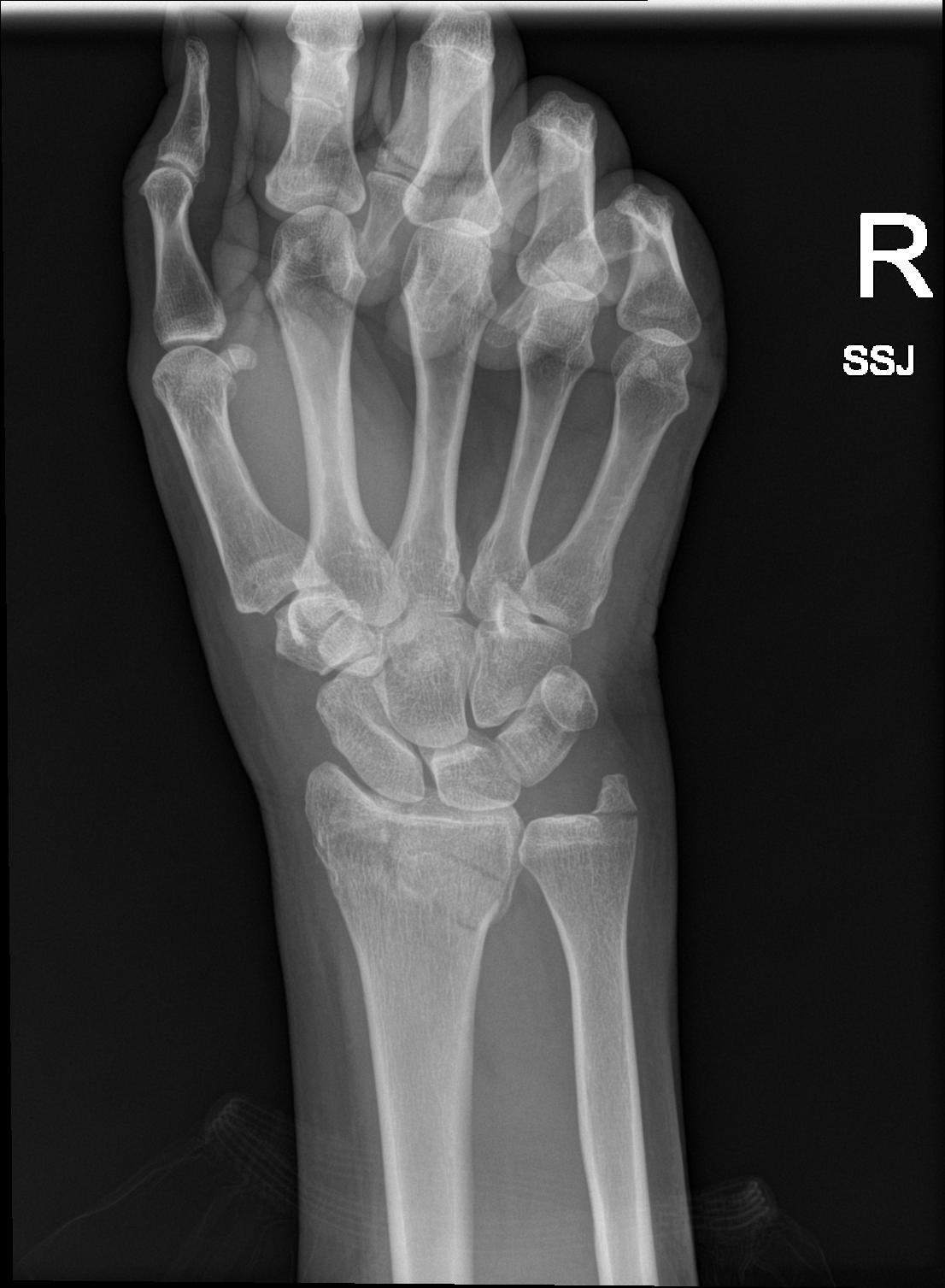

[wrist obl]
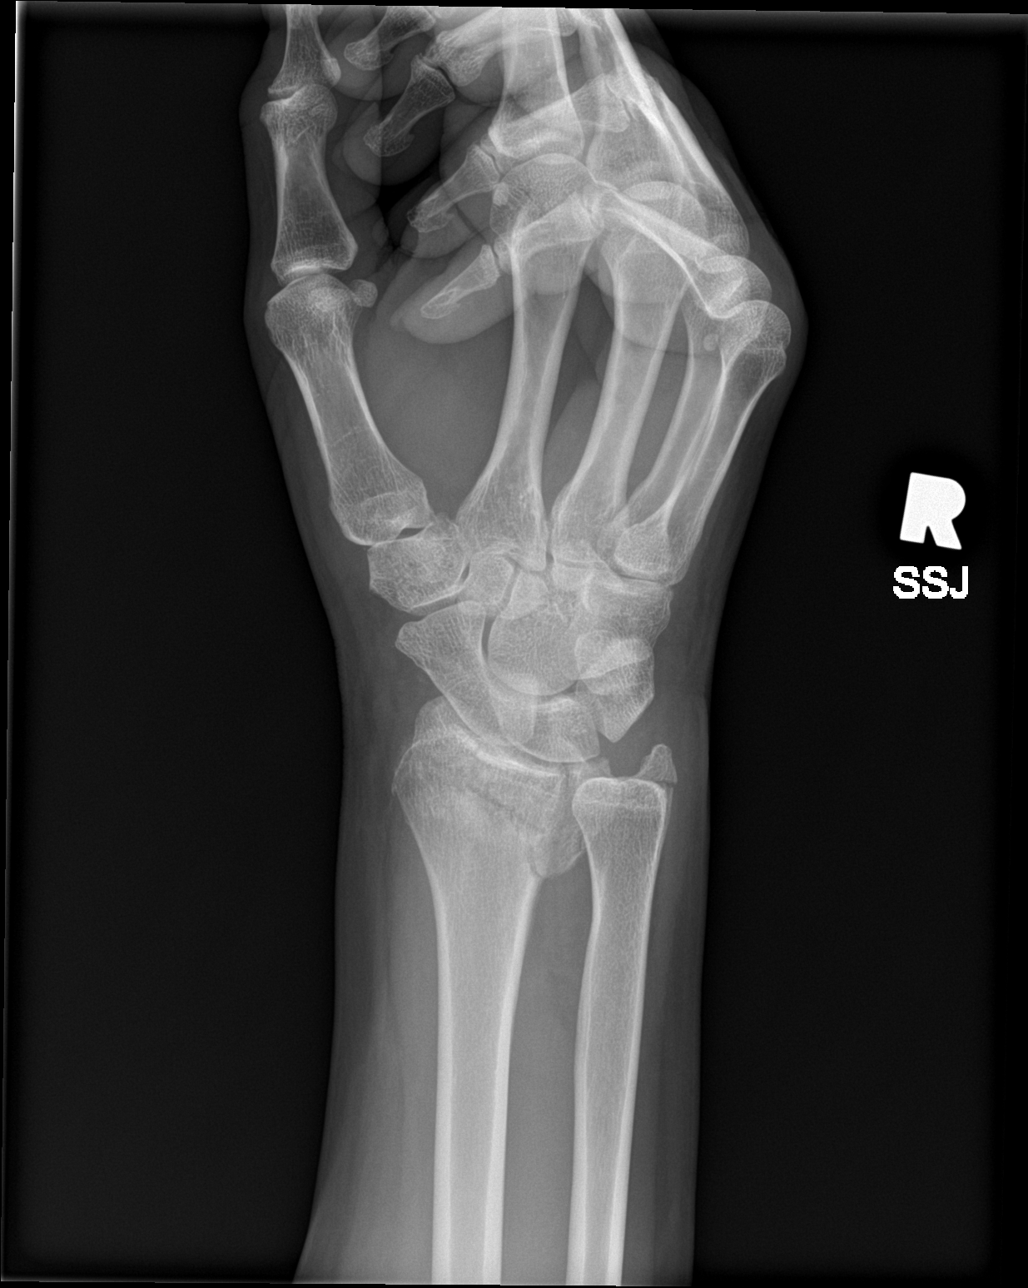

[wrist lat]
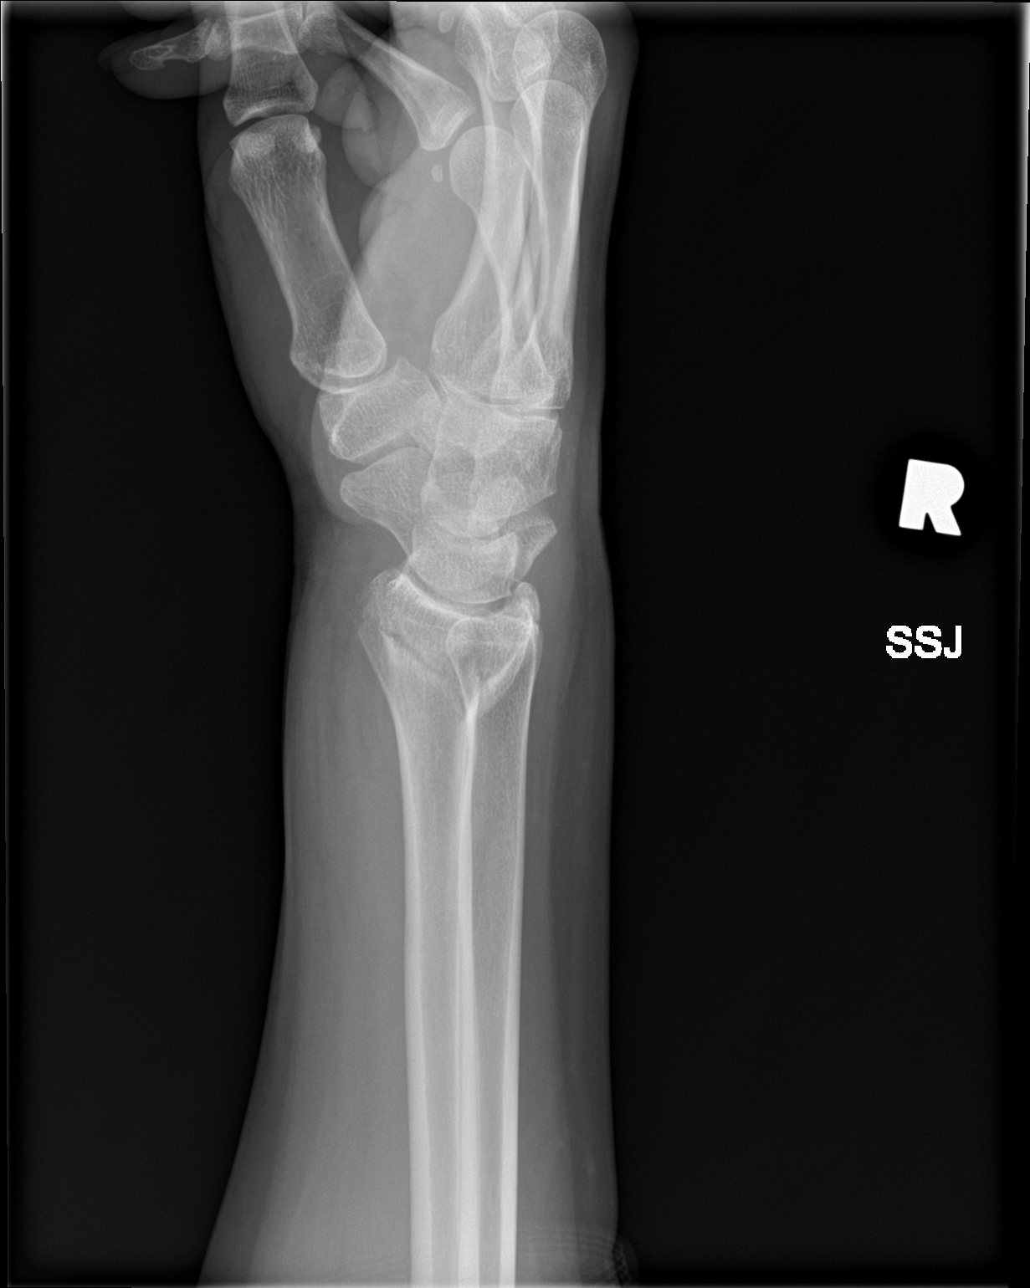

[navicular]
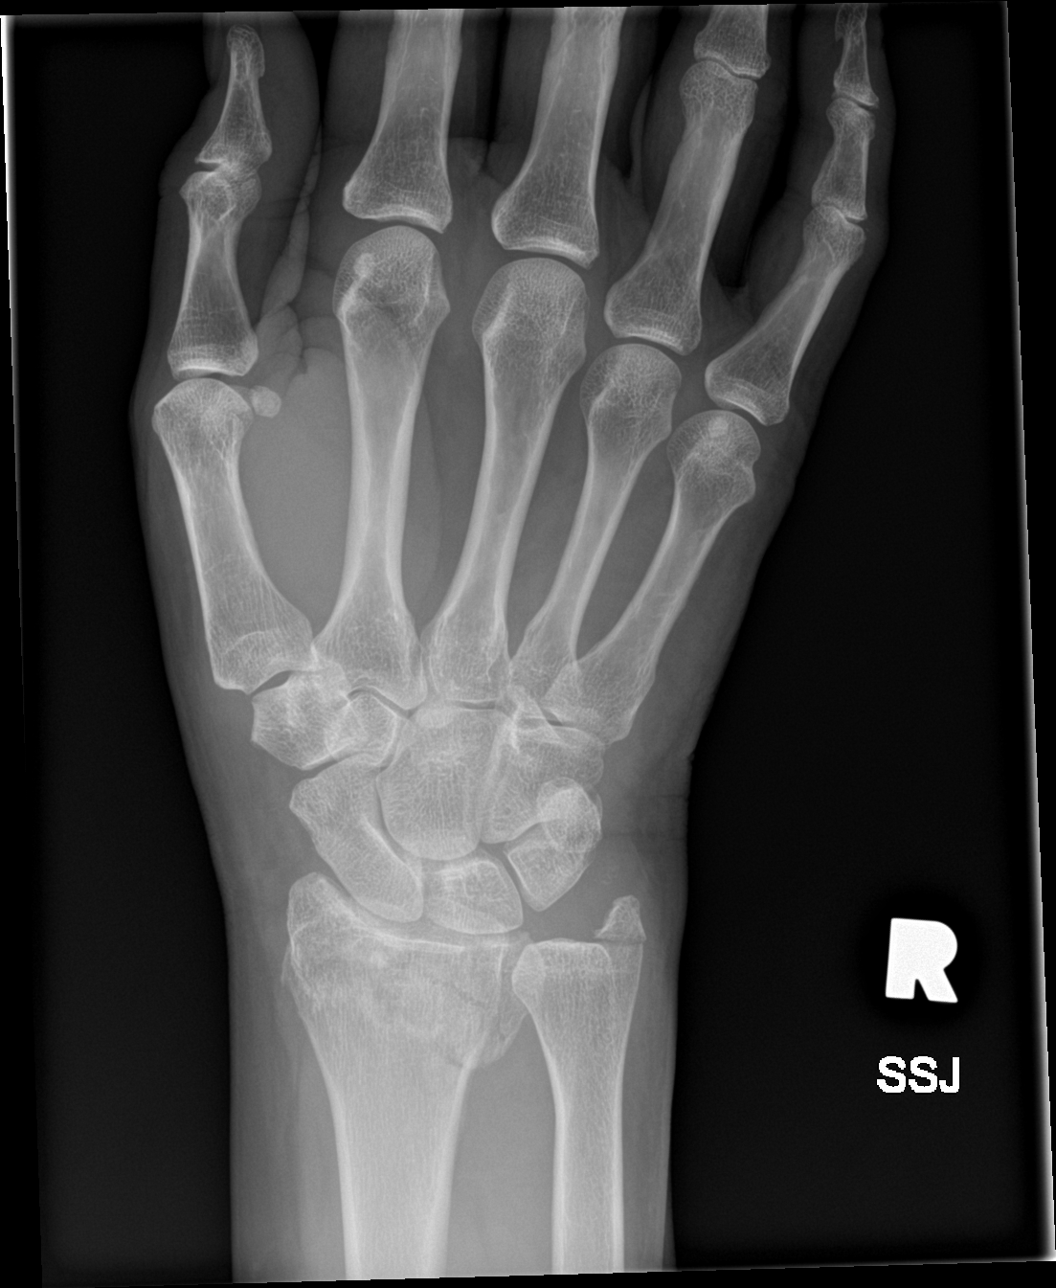

[4 of 4 positions shown; findings below may reference images not displayed]

FINDINGS: Comminuted fracture of the distal radial metaphysis with likely
extension into the distal radioulnar joint and articular surface of
the radiocarpal joint. Mild dorsal angulation across the dominant
fracture line. Minimally displaced ulnar styloid process fracture
noted as well. Circumferential soft tissue swelling of the wrist. No
other acute fracture or osseous injury. The scaphoid is intact. Arcs
of the wrist are maintained.
IMPRESSION: Comminuted slightly dorsal angulated fracture of the distal radial
metaphysis with likely extension into the distal radioulnar joint
and radiocarpal joint with associated ulnar styloid process fracture
(Hisanori type VII).

## 2021-11-06 LAB — HM PAP SMEAR: HM Pap smear: NEGATIVE

## 2021-12-06 ENCOUNTER — Other Ambulatory Visit: Payer: Self-pay | Admitting: Obstetrics and Gynecology

## 2021-12-07 ENCOUNTER — Other Ambulatory Visit: Payer: Self-pay | Admitting: Obstetrics and Gynecology

## 2021-12-07 DIAGNOSIS — E2839 Other primary ovarian failure: Secondary | ICD-10-CM

## 2022-01-08 ENCOUNTER — Ambulatory Visit
Admission: RE | Admit: 2022-01-08 | Discharge: 2022-01-08 | Disposition: A | Discharge status: Home / Self Care | Payer: 59 | Source: Ambulatory Visit | Attending: Obstetrics and Gynecology | Admitting: Obstetrics and Gynecology

## 2022-01-08 DIAGNOSIS — E2839 Other primary ovarian failure: Secondary | ICD-10-CM | POA: Insufficient documentation

## 2022-02-06 LAB — CBC AND DIFFERENTIAL
HCT: 41 (ref 36–46)
Hemoglobin: 13.6 (ref 12.0–16.0)
Neutrophils Absolute: 27.6
Platelets: 326 10*3/uL (ref 150–400)
WBC: 5.5

## 2022-02-06 LAB — VITAMIN D 25 HYDROXY (VIT D DEFICIENCY, FRACTURES): Vit D, 25-Hydroxy: 27

## 2022-02-06 LAB — HEMOGLOBIN A1C: Hemoglobin A1C: 5.6

## 2022-02-06 LAB — BASIC METABOLIC PANEL
Creatinine: 0.7 (ref 0.5–1.1)
Glucose: 101

## 2022-02-06 LAB — HEPATIC FUNCTION PANEL
ALT: 21 U/L (ref 7–35)
AST: 17 (ref 13–35)
Alkaline Phosphatase: 65 (ref 25–125)
Bilirubin, Direct: 0.1
Bilirubin, Total: 0.5

## 2022-02-06 LAB — IRON,TIBC AND FERRITIN PANEL
%SAT: 30
Ferritin: 93
Iron: 107
TIBC: 361

## 2022-02-06 LAB — LIPID PANEL
Cholesterol: 218 — AB (ref 0–200)
HDL: 74 — AB (ref 35–70)
LDL Cholesterol: 129
LDl/HDL Ratio: 2.9
Triglycerides: 48 (ref 40–160)

## 2022-02-06 LAB — COMPREHENSIVE METABOLIC PANEL WITH GFR
Albumin: 4.5 (ref 3.5–5.0)
Calcium: 9.7 (ref 8.7–10.7)
Globulin: 2.2
eGFR: 95

## 2022-02-06 LAB — TSH: TSH: 1.21 (ref 0.41–5.90)

## 2022-02-06 LAB — CBC: RBC: 4.68 (ref 3.87–5.11)

## 2022-06-04 NOTE — Progress Notes (Unsigned)
I,Brenda Steele,acting as a Education administrator for Brenda Paganini, MD.,have documented all relevant documentation on the behalf of Brenda Paganini, MD,as directed by  Brenda Paganini, MD while in the presence of Brenda Paganini, MD.    Complete physical exam   Patient: Brenda Steele   DOB: 1965-08-22   57 y.o. Female  MRN: [id number] Visit Date: 06/05/2022  Today's healthcare provider: [name], MD   No chief complaint on file.  Subjective    Brenda Steele is a 57 y.o. female who presents today for a complete physical exam.  She reports consuming a {diet types:17450} diet. {Exercise:19826} She generally feels {well/fairly well/poorly:18703}. She reports sleeping {well/fairly well/poorly:18703}. She {does/does not:200015} have additional problems to discuss today.  HPI  ***  Past Medical History:  Diagnosis Date   Arthritis    Avitaminosis D    Past Surgical History:  Procedure Laterality Date   NO PAST SURGERIES     Social History   Socioeconomic History   Marital status: Married    Spouse name: Not on file   Number of children: 2   Years of education: Not on file   Highest education level: Not on file  Occupational History   Occupation: house cleaner  Tobacco Use   Smoking status: Never   Smokeless tobacco: Never  Vaping Use   Vaping Use: Never used  Substance and Sexual Activity   Alcohol use: Yes    Alcohol/week: 2.0 standard drinks of alcohol    Types: 2 Glasses of wine per week   Drug use: Never   Sexual activity: Yes    Partners: Male    Birth control/protection: Post-menopausal  Other Topics Concern   Not on file  Social History Narrative   Not on file   Social Determinants of Health   Financial Resource Strain: Not on file  Food Insecurity: Not on file  Transportation Needs: Not on file  Physical Activity: Not on file  Stress: Not on file  Social Connections: Not on file  Intimate Partner Violence: Not on file   Family Status   Relation Name Status   Mother  Alive   Father  Deceased   Sister  Alive   Daughter  Alive   Sister  Alive   Daughter  Alive   Family History  Problem Relation Age of Onset   Diabetes Mother    Thrombophilia Father    Hypertension Sister    Hyperlipidemia Sister    Healthy Daughter    Ulcerative colitis Daughter        proctitis   No Known Allergies  Patient Care Team: [place], Dionne Bucy, MD as PCP - General (Family Medicine)   Medications: Outpatient Medications Prior to Visit  Medication Sig   cholecalciferol (VITAMIN D3) 25 MCG (1000 UNIT) tablet Take 1,000 Units by mouth daily.   clobetasol ointment (TEMOVATE) 0.05 % Apply topically 2 (two) times daily.   phentermine (ADIPEX-P) 37.5 MG tablet Take 37.5 mg by mouth every morning.   No facility-administered medications prior to visit.    Review of Systems  {Labs  Heme  Chem  Endocrine  Serology  Results Review (optional):23779}  Objective    There were no vitals taken for this visit. {Show previous vital signs (optional):23777}   Physical Exam  ***  Last depression screening scores    01/01/2020   11:55 AM 08/18/2019    8:29 AM  PHQ 2/9 Scores  PHQ - 2 Score 0 0  PHQ- 9 Score 0  0   Last fall risk screening    01/01/2020   11:56 AM  [place] in the past year? 0  Number falls in past yr: 0  Injury with Fall? 0  Risk for fall due to : No Fall Risks  Follow up Falls evaluation completed   Last Audit-C alcohol use screening    01/01/2020   11:56 AM  Alcohol Use Disorder Test (AUDIT)  1. How often do you have a drink containing alcohol? 2  2. How many drinks containing alcohol do you have on a typical day when you are drinking? 0  3. How often do you have six or more drinks on one occasion? 0  AUDIT-C Score 2  Alcohol Brief Interventions/Follow-up AUDIT Score <7 follow-up not indicated   A score of 3 or more in women, and 4 or more in men indicates increased risk for alcohol abuse,  EXCEPT if all of the points are from question 1   No results found for any visits on 06/05/22.  Assessment & Plan    Routine Health Maintenance and Physical Exam  Exercise Activities and Dietary recommendations  Goals   None     Immunization History  Administered Date(s) Administered   Influenza Split 01/19/2009   Influenza,inj,Quad PF,6+ Mos 12/31/2019   PFIZER(Purple Top)SARS-COV-2 Vaccination 06/25/2019, 07/16/2019   Tdap 01/19/2009   Zoster Recombinat (Shingrix) 12/31/2019    Health Maintenance  Topic Date Due   HIV Screening  Never done   Hepatitis C Screening  Never done   PAP SMEAR-Modifier  Never done   COLONOSCOPY (Pts 45-41yr Insurance coverage will need to be confirmed)  Never done   MAMMOGRAM  Never done   DTaP/Tdap/Td (2 - Td or Tdap) 01/20/2019   COVID-19 Vaccine (3 - Pfizer risk series) 08/13/2019   Zoster Vaccines- Shingrix (2 of 2) 02/25/2020   COLON CANCER SCREENING ANNUAL FOBT  08/07/2020   INFLUENZA VACCINE  11/07/2021   HPV VACCINES  Aged Out    Discussed health benefits of physical activity, and encouraged her to engage in regular exercise appropriate for her age and condition.  ***  No follow-ups on file.     {provider attestation***:1}   ALavon Paganini MD  [phone](phone) [phone](fax)  [place]

## 2022-06-05 ENCOUNTER — Ambulatory Visit (INDEPENDENT_AMBULATORY_CARE_PROVIDER_SITE_OTHER): Payer: 59 | Admitting: Family Medicine

## 2022-06-05 ENCOUNTER — Encounter: Payer: Self-pay | Admitting: Family Medicine

## 2022-06-05 VITALS — BP 110/74 | HR 92 | Temp 97.7°F | Resp 16 | Ht 66.0 in | Wt 165.4 lb

## 2022-06-05 DIAGNOSIS — Z23 Encounter for immunization: Secondary | ICD-10-CM | POA: Diagnosis not present

## 2022-06-05 DIAGNOSIS — M62838 Other muscle spasm: Secondary | ICD-10-CM

## 2022-06-05 DIAGNOSIS — Z1159 Encounter for screening for other viral diseases: Secondary | ICD-10-CM

## 2022-06-05 DIAGNOSIS — Z0000 Encounter for general adult medical examination without abnormal findings: Secondary | ICD-10-CM | POA: Diagnosis not present

## 2022-06-05 DIAGNOSIS — Z1211 Encounter for screening for malignant neoplasm of colon: Secondary | ICD-10-CM | POA: Diagnosis not present

## 2022-06-05 DIAGNOSIS — Z114 Encounter for screening for human immunodeficiency virus [HIV]: Secondary | ICD-10-CM | POA: Diagnosis not present

## 2022-06-05 NOTE — Addendum Note (Signed)
Addended by: Shawna Orleans on: 06/05/2022 04:42 PM   Modules accepted: Orders

## 2022-06-06 ENCOUNTER — Telehealth: Payer: Self-pay

## 2022-06-06 ENCOUNTER — Other Ambulatory Visit: Payer: Self-pay

## 2022-06-06 DIAGNOSIS — Z1211 Encounter for screening for malignant neoplasm of colon: Secondary | ICD-10-CM

## 2022-06-06 MED ORDER — NA SULFATE-K SULFATE-MG SULF 17.5-3.13-1.6 GM/177ML PO SOLN: 1.0000 | Freq: Once | ORAL | 0 refills | Status: AC

## 2022-06-06 NOTE — Telephone Encounter (Signed)
Gastroenterology Pre-Procedure Review  Per Dr. Marius Ditch pt has been advised to stop Phentermine 1 day before colonoscopy.  Request Date: 07/20/22 Requesting Physician: Dr. Marius Ditch  PATIENT REVIEW QUESTIONS: The patient responded to the following health history questions as indicated:    1. Are you having any GI issues? no 2. Do you have a personal history of Polyps? no 3. Do you have a family history of Colon Cancer or Polyps? no 4. Diabetes Mellitus? no 5. Joint replacements in the past 12 months?no 6. Major health problems in the past 3 months?no 7. Any artificial heart valves, MVP, or defibrillator?no    MEDICATIONS & ALLERGIES:    Patient reports the following regarding taking any anticoagulation/antiplatelet therapy:   Plavix, Coumadin, Eliquis, Xarelto, Lovenox, Pradaxa, Brilinta, or Effient? no Aspirin? no  Patient confirms/reports the following medications:  Current Outpatient Medications  Medication Sig Dispense Refill   cholecalciferol (VITAMIN D3) 25 MCG (1000 UNIT) tablet Take 1,000 Units by mouth daily.     phentermine (ADIPEX-P) 37.5 MG tablet Take 37.5 mg by mouth every morning.     No current facility-administered medications for this visit.    Patient confirms/reports the following allergies:  No Known Allergies  No orders of the defined types were placed in this encounter.   AUTHORIZATION INFORMATION Primary Insurance: 1D#: Group #:  Secondary Insurance: 1D#: Group #:  SCHEDULE INFORMATION: Date: 07/20/22 Time: Location: [facility]

## 2022-06-26 ENCOUNTER — Encounter: Payer: Self-pay | Admitting: Family Medicine

## 2022-07-20 ENCOUNTER — Encounter: Payer: Self-pay | Admitting: Gastroenterology

## 2022-07-20 ENCOUNTER — Ambulatory Visit
Admission: RE | Admit: 2022-07-20 | Discharge: 2022-07-20 | Disposition: A | Discharge status: Home / Self Care | Payer: 59 | Attending: Gastroenterology | Admitting: Gastroenterology

## 2022-07-20 ENCOUNTER — Ambulatory Visit: Payer: 59 | Admitting: Certified Registered Nurse Anesthetist

## 2022-07-20 ENCOUNTER — Encounter
Admission: RE | Disposition: A | Discharge status: Home / Self Care | Payer: Self-pay | Source: Home / Self Care | Attending: Gastroenterology

## 2022-07-20 DIAGNOSIS — Z1211 Encounter for screening for malignant neoplasm of colon: Secondary | ICD-10-CM

## 2022-07-20 DIAGNOSIS — K635 Polyp of colon: Secondary | ICD-10-CM

## 2022-07-20 DIAGNOSIS — D123 Benign neoplasm of transverse colon: Secondary | ICD-10-CM | POA: Insufficient documentation

## 2022-07-20 HISTORY — PX: COLONOSCOPY WITH PROPOFOL: SHX5780

## 2022-07-20 SURGERY — COLONOSCOPY WITH PROPOFOL
Anesthesia: General

## 2022-07-20 MED ORDER — SIMETHICONE 40 MG/0.6ML PO SUSP
ORAL | Status: DC | PRN
  Administered 2022-07-20: 120 mL

## 2022-07-20 MED ORDER — PROPOFOL 500 MG/50ML IV EMUL
INTRAVENOUS | Status: DC | PRN
  Administered 2022-07-20: 180 ug/kg/min via INTRAVENOUS

## 2022-07-20 MED ORDER — PROPOFOL 10 MG/ML IV BOLUS
INTRAVENOUS | Status: DC | PRN
  Administered 2022-07-20: 70 mg via INTRAVENOUS

## 2022-07-20 MED ORDER — SODIUM CHLORIDE 0.9 % IV SOLN: INTRAVENOUS | Status: DC

## 2022-07-20 NOTE — Anesthesia Postprocedure Evaluation (Signed)
Anesthesia Post Note  Patient: Brenda Steele  Procedure(s) Performed: COLONOSCOPY WITH PROPOFOL  Patient location during evaluation: Endoscopy Anesthesia Type: General Level of consciousness: awake and alert Pain management: pain level controlled Vital Signs Assessment: post-procedure vital signs reviewed and stable Respiratory status: spontaneous breathing, nonlabored ventilation, respiratory function stable and patient connected to nasal cannula oxygen Cardiovascular status: blood pressure returned to baseline and stable Postop Assessment: no apparent nausea or vomiting Anesthetic complications: no  No notable events documented.   Last Vitals:  Vitals:   07/20/22 0809 07/20/22 0931  BP: (!) 117/92 123/78  Pulse: 87 78  Resp: 16 20  Temp: (!) 36.2 C (!) 36.2 C  SpO2: 100% 98%    Last Pain:  Vitals:   07/20/22 0931  TempSrc: Temporal  PainSc: 0-No pain                 Stephanie Coup

## 2022-07-20 NOTE — Op Note (Signed)
[facility] Gastroenterology Patient Name: Brenda Steele Procedure Date: 07/20/2022 8:54 AM MRN: [id number] Account #: [id number] Date of Birth: 08/09/1965 Admit Type: Outpatient Age: 57 Room: [facility] ENDO ROOM 1 Gender: Female Note Status: Finalized Instrument Name: Prentice Docker [id number] Procedure:             Colonoscopy Indications:           Screening for colorectal malignant neoplasm, This is                         the patient's first colonoscopy Providers:             Toney Reil MD, MD Referring MD:          Marzella Schlein. Bacigalupo (Referring MD) Medicines:             General Anesthesia Complications:         No immediate complications. Estimated blood loss: None. Procedure:             Pre-Anesthesia Assessment:                        - Prior to the procedure, a History and Physical was                         performed, and patient medications and allergies were                         reviewed. The patient is competent. The risks and                         benefits of the procedure and the sedation options and                         risks were discussed with the patient. All questions                         were answered and informed consent was obtained.                         Patient identification and proposed procedure were                         verified by the physician, the nurse, the                         anesthesiologist, the anesthetist and the technician                         in the pre-procedure area in the procedure room in the                         endoscopy suite. Mental Status Examination: alert and                         oriented. Airway Examination: normal oropharyngeal                         airway and neck mobility. Respiratory Examination:  clear to auscultation. CV Examination: normal.                         Prophylactic Antibiotics: The patient does not require                          prophylactic antibiotics. Prior Anticoagulants: The                         patient has taken no anticoagulant or antiplatelet                         agents. ASA Grade Assessment: I - A normal, healthy                         patient. After reviewing the risks and benefits, the                         patient was deemed in satisfactory condition to                         undergo the procedure. The anesthesia plan was to use                         general anesthesia. Immediately prior to                         administration of medications, the patient was                         re-assessed for adequacy to receive sedatives. The                         heart rate, respiratory rate, oxygen saturations,                         blood pressure, adequacy of pulmonary ventilation, and                         response to care were monitored throughout the                         procedure. The physical status of the patient was                         re-assessed after the procedure.                        After obtaining informed consent, the colonoscope was                         passed under direct vision. Throughout the procedure,                         the patient's blood pressure, pulse, and oxygen                         saturations were monitored continuously. The  Colonoscope was introduced through the anus and                         advanced to the the cecum, identified by appendiceal                         orifice and ileocecal valve. The colonoscopy was                         unusually difficult due to significant looping and a                         tortuous colon. Successful completion of the procedure                         was aided by applying abdominal pressure. The patient                         tolerated the procedure well. The quality of the bowel                         preparation was evaluated using the BBPS [facility] Bowel                          Preparation Scale) with scores of: Right Colon = 3,                         Transverse Colon = 3 and Left Colon = 3 (entire mucosa                         seen well with no residual staining, small fragments                         of stool or opaque liquid). The total BBPS score                         equals 9. The ileocecal valve, appendiceal orifice,                         and rectum were photographed. Findings:      The perianal and digital rectal examinations were normal. Pertinent       negatives include normal sphincter tone and no palpable rectal lesions.      Two sessile polyps were found in the transverse colon. The polyps were 4       to 5 mm in size. These polyps were removed with a cold snare. Resection       and retrieval were complete. Estimated blood loss: none.      The retroflexed view of the distal rectum and anal verge was normal and       showed no anal or rectal abnormalities. Impression:            - Two 4 to 5 mm polyps in the transverse colon,                         removed with a cold snare. Resected and retrieved.                        -  The distal rectum and anal verge are normal on                         retroflexion view. Recommendation:        - Discharge patient to home (with escort).                        - Resume previous diet today.                        - Continue present medications.                        - Await pathology results.                        - Repeat colonoscopy in 5 years for surveillance. Procedure Code(s):     --- Professional ---                        [phone], Colonoscopy, flexible; with removal of                         tumor(s), polyp(s), or other lesion(s) by snare                         technique Diagnosis Code(s):     --- Professional ---                        Z12.11, Encounter for screening for malignant neoplasm                         of colon                        D12.3, Benign neoplasm of transverse colon  (hepatic                         flexure or splenic flexure) CPT copyright 2022 American Medical Association. All rights reserved. The codes documented in this report are preliminary and upon coder review may  be revised to meet current compliance requirements. Dr. Libby Maw Toney Reil MD, MD 07/20/2022 9:29:47 AM This report has been signed electronically. Number of Addenda: 0 Note Initiated On: 07/20/2022 8:54 AM Scope Withdrawal Time: 0 hours 11 minutes 57 seconds  Total Procedure Duration: 0 hours 23 minutes 11 seconds  Estimated Blood Loss:  Estimated blood loss: none.      [facility]

## 2022-07-20 NOTE — Transfer of Care (Signed)
Immediate Anesthesia Transfer of Care Note  Patient: Brenda Steele  Procedure(s) Performed: COLONOSCOPY WITH PROPOFOL  Patient Location: PACU  Anesthesia Type:General  Level of Consciousness: awake and alert   Airway & Oxygen Therapy: Patient Spontanous Breathing  Post-op Assessment: Report given to RN and Post -op Vital signs reviewed and stable  Post vital signs: Reviewed and stable  Last Vitals:  Vitals Value Taken Time  BP    Temp    Pulse 78 07/20/22 0931  Resp 22 07/20/22 0931  SpO2 100 % 07/20/22 0931  Vitals shown include unvalidated device data.  Last Pain: There were no vitals filed for this visit.       Complications: No notable events documented.

## 2022-07-20 NOTE — Anesthesia Preprocedure Evaluation (Signed)
Anesthesia Evaluation  Patient identified by MRN, date of birth, ID band Patient awake    Reviewed: Allergy & Precautions, NPO status , Patient's Chart, lab work & pertinent test results  Airway Mallampati: III  TM Distance: >3 FB Neck ROM: full    Dental  (+) Chipped   Pulmonary neg pulmonary ROS   Pulmonary exam normal        Cardiovascular negative cardio ROS Normal cardiovascular exam     Neuro/Psych negative neurological ROS  negative psych ROS   GI/Hepatic negative GI ROS, Neg liver ROS,,,  Endo/Other  negative endocrine ROS    Renal/GU negative Renal ROS  negative genitourinary   Musculoskeletal   Abdominal   Peds  Hematology negative hematology ROS (+)   Anesthesia Other Findings Past Medical History: No date: Arthritis No date: Avitaminosis D  Past Surgical History: No date: NO PAST SURGERIES  BMI    Body Mass Index: 25.73 kg/m      Reproductive/Obstetrics negative OB ROS                             Anesthesia Physical Anesthesia Plan  ASA: 1  Anesthesia Plan: General   Post-op Pain Management: Minimal or no pain anticipated   Induction: Intravenous  PONV Risk Score and Plan: 3 and Propofol infusion, TIVA and Ondansetron  Airway Management Planned: Nasal Cannula  Additional Equipment: None  Intra-op Plan:   Post-operative Plan:   Informed Consent: I have reviewed the patients History and Physical, chart, labs and discussed the procedure including the risks, benefits and alternatives for the proposed anesthesia with the patient or authorized representative who has indicated his/her understanding and acceptance.     Dental advisory given  Plan Discussed with: CRNA and Surgeon  Anesthesia Plan Comments: (Discussed risks of anesthesia with patient, including possibility of difficulty with spontaneous ventilation under anesthesia necessitating airway  intervention, PONV, and rare risks such as cardiac or respiratory or neurological events, and allergic reactions. Discussed the role of CRNA in patient's perioperative care. Patient understands.)       Anesthesia Quick Evaluation

## 2022-07-20 NOTE — Anesthesia Procedure Notes (Signed)
Date/Time: 07/20/2022 9:04 AM  Performed by: Malva Cogan, CRNAPre-anesthesia Checklist: Patient identified, Emergency Drugs available, Suction available, Patient being monitored and Timeout performed Patient Re-evaluated:Patient Re-evaluated prior to induction Oxygen Delivery Method: Nasal cannula Induction Type: IV induction Placement Confirmation: CO2 detector and positive ETCO2

## 2022-07-20 NOTE — H&P (Signed)
Arlyss Repress, MD [address]  [address]  [place], [place] [address]  Main: [phone]  Fax: [phone] Pager: [phone]  Primary Care Physician:  Erasmo Downer, MD Primary Gastroenterologist:  Dr. Arlyss Repress  Pre-Procedure History & Physical: HPI:  Brenda Steele is a 57 y.o. female is here for an colonoscopy.   Past Medical History:  Diagnosis Date   Arthritis    Avitaminosis D     Past Surgical History:  Procedure Laterality Date   NO PAST SURGERIES      Prior to Admission medications   Medication Sig Start Date End Date Taking? Authorizing Provider  Ascorbic Acid (VITAMIN C) 1000 MG tablet Take 1,000 mg by mouth daily.   Yes [provider]  phentermine (ADIPEX-P) 37.5 MG tablet Take 37.5 mg by mouth every morning. 06/22/21  Yes [provider]  vitamin B-12 (CYANOCOBALAMIN) 100 MCG tablet Take 100 mcg by mouth daily.   Yes [provider]  cholecalciferol (VITAMIN D3) 25 MCG (1000 UNIT) tablet Take 1,000 Units by mouth daily.    [provider]    Allergies as of 06/06/2022   (No Known Allergies)    Family History  Problem Relation Age of Onset   Diabetes Mother    Thrombophilia Father    Hypertension Sister    Hyperlipidemia Sister    Healthy Daughter    Ulcerative colitis Daughter        proctitis    Social History   Socioeconomic History   Marital status: Married    Spouse name: Not on file   Number of children: 2   Years of education: Not on file   Highest education level: Not on file  Occupational History   Occupation: house cleaner  Tobacco Use   Smoking status: Never   Smokeless tobacco: Never  Vaping Use   Vaping Use: Never used  Substance and Sexual Activity   Alcohol use: Yes    Alcohol/week: 2.0 standard drinks of alcohol    Types: 2 Glasses of wine per week   Drug use: Never   Sexual activity: Yes    Partners: Male    Birth control/protection: Post-menopausal  Other  Topics Concern   Not on file  Social History Narrative   Not on file   Social Determinants of Health   Financial Resource Strain: Not on file  Food Insecurity: Not on file  Transportation Needs: Not on file  Physical Activity: Not on file  Stress: Not on file  Social Connections: Not on file  Intimate Partner Violence: Not on file    Review of Systems: See HPI, otherwise negative ROS  Physical Exam: BP (!) 117/92   Pulse 87   Temp (!) 97.1 F (36.2 C)   Resp 16   Wt 72.3 kg   SpO2 100%   BMI 25.73 kg/m  General:   Alert,  pleasant and cooperative in NAD Head:  Normocephalic and atraumatic. Neck:  Supple; no masses or thyromegaly. Lungs:  Clear throughout to auscultation.    Heart:  Regular rate and rhythm. Abdomen:  Soft, nontender and nondistended. Normal bowel sounds, without guarding, and without rebound.   Neurologic:  Alert and  oriented x4;  grossly normal neurologically.  Impression/Plan: Brenda Steele is here for an colonoscopy to be performed for colon cancer screening  Risks, benefits, limitations, and alternatives regarding  colonoscopy have been reviewed with the patient.  Questions have been answered.  All parties agreeable.   Jahvon Gosline  Allegra Lai, MD  07/20/2022, 8:23 AM

## 2022-07-23 ENCOUNTER — Encounter: Payer: Self-pay | Admitting: Gastroenterology

## 2022-07-23 LAB — SURGICAL PATHOLOGY

## 2022-07-24 ENCOUNTER — Encounter: Payer: Self-pay | Admitting: Gastroenterology

## 2022-08-15 ENCOUNTER — Encounter: Payer: Self-pay | Admitting: Gastroenterology

## 2023-01-23 LAB — HM MAMMOGRAPHY

## 2023-02-05 LAB — LAB REPORT - SCANNED
A1c: 5.7
EGFR: 103

## 2023-06-07 ENCOUNTER — Ambulatory Visit (INDEPENDENT_AMBULATORY_CARE_PROVIDER_SITE_OTHER): Payer: 59 | Admitting: Family Medicine

## 2023-06-07 ENCOUNTER — Encounter: Payer: Self-pay | Admitting: Family Medicine

## 2023-06-07 VITALS — BP 106/68 | HR 78 | Ht 66.5 in | Wt 166.6 lb

## 2023-06-07 DIAGNOSIS — E663 Overweight: Secondary | ICD-10-CM | POA: Diagnosis not present

## 2023-06-07 DIAGNOSIS — R7303 Prediabetes: Secondary | ICD-10-CM | POA: Diagnosis not present

## 2023-06-07 DIAGNOSIS — E782 Mixed hyperlipidemia: Secondary | ICD-10-CM

## 2023-06-07 DIAGNOSIS — Z0000 Encounter for general adult medical examination without abnormal findings: Secondary | ICD-10-CM

## 2023-06-07 DIAGNOSIS — Z0001 Encounter for general adult medical examination with abnormal findings: Secondary | ICD-10-CM | POA: Diagnosis not present

## 2023-06-07 NOTE — Assessment & Plan Note (Signed)
 A1c of 5.7% indicating prediabetes. Discussed that prediabetes is a transitional state between normal glucose levels and diabetes, emphasizing the importance of monitoring to prevent progression. - Monitor A1c levels in October 2025

## 2023-06-07 NOTE — Assessment & Plan Note (Signed)
 Elevated LDL and total cholesterol with high HDL. Triglycerides are normal. 10-year cardiovascular risk is 1.4%. Discussed dietary impact on cholesterol and the importance of exercise. - Monitor cholesterol levels annually - Encourage dietary modifications to reduce intake of fried foods and red meats - Encourage regular exercise

## 2023-06-07 NOTE — Progress Notes (Signed)
 Complete physical exam   Patient: Brenda Steele   DOB: September 14, 1965   58 y.o. Female  MRN: [id number] Visit Date: 06/07/2023  Today's healthcare provider: Shirlee Latch, MD   Chief Complaint  Patient presents with   Annual Exam    Last completed 06/05/22 Diet -  General, well balanced  Exercise - very active but no set regimen Feeling - well Sleeping - well Concerns - none    Care Management    Colonoscopy completed 07/20/2022 by Dr.Vanga Mammogram  completed 01/23/23 Influenza Vaccine declined HIV Screening declined Hepatitis C Screening declined Pneumococcal Vaccines declined   Subjective    Brenda Steele is a 58 y.o. female who presents today for a complete physical exam.    Discussed the use of AI scribe software for clinical note transcription with the patient, who gave verbal consent to proceed.  History of Present Illness   The patient, with a history of prediabetes and arthritis, presents for a routine physical examination. She reports experiencing inflammation and swelling in her hands, which she describes as "fat sausage fingers". She mentions that she has difficulty removing her wedding ring due to the swelling. The patient also notes that she sees a resemblance between her hands and her mother's hands, which she describes as "knobby".  The patient discusses her recent lab results, which show a slight increase in her cholesterol levels. She admits to consuming foods high in sodium and fats, such as hot dogs, peanuts, tortilla chips, and queso dip. She also mentions that she has a steak once a week. Despite these dietary habits, the patient states that she does not typically add salt to her food.  The patient also mentions that she has been taking vitamin D and calcium supplements, which have helped improve her previously low vitamin D levels. She also states that she has not received a flu shot and has declined the pneumonia vaccine, but is open to considering  it in the future.       The 10-year ASCVD risk score (Arnett DK, et al., 2019) is: 1.4%* (Cholesterol units were assumed)   Last depression screening scores    06/05/2022    4:06 PM 01/01/2020   11:55 AM 08/18/2019    8:29 AM  PHQ 2/9 Scores  PHQ - 2 Score 0 0 0  PHQ- 9 Score 0 0 0   Last fall risk screening    06/05/2022    4:06 PM  Fall Risk   Falls in the past year? 0  Number falls in past yr: 0  Injury with Fall? 0  Risk for fall due to : No Fall Risks  Follow up Falls evaluation completed        Medications: Outpatient Medications Prior to Visit  Medication Sig   Ascorbic Acid (VITAMIN C) 1000 MG tablet Take 1,000 mg by mouth daily.   cholecalciferol (VITAMIN D3) 25 MCG (1000 UNIT) tablet Take 1,000 Units by mouth daily.   neomycin-polymyxin b-dexamethasone (MAXITROL) 3.5-10000-0.1 SUSP Place 1 drop into the right eye 4 (four) times daily.   phentermine (ADIPEX-P) 37.5 MG tablet Take 37.5 mg by mouth every morning.   vitamin B-12 (CYANOCOBALAMIN) 100 MCG tablet Take 100 mcg by mouth daily.   No facility-administered medications prior to visit.    Review of Systems    Objective    BP 106/68 (BP Location: Left Arm, Patient Position: Sitting, Cuff Size: Normal)   Pulse 78   Ht 5' 6.5" (1.689 m)  Wt 166 lb 9.6 oz (75.6 kg)   SpO2 98%   BMI 26.49 kg/m    Physical Exam Vitals reviewed.  Constitutional:      General: She is not in acute distress.    Appearance: Normal appearance. She is well-developed. She is not diaphoretic.  HENT:     Head: Normocephalic and atraumatic.     Right Ear: Tympanic membrane, ear canal and external ear normal.     Left Ear: Tympanic membrane, ear canal and external ear normal.     Nose: Nose normal.     Mouth/Throat:     Mouth: Mucous membranes are moist.     Pharynx: Oropharynx is clear. No oropharyngeal exudate.  Eyes:     General: No scleral icterus.    Conjunctiva/sclera: Conjunctivae normal.     Pupils: Pupils are  equal, round, and reactive to light.  Neck:     Thyroid: No thyromegaly.  Cardiovascular:     Rate and Rhythm: Normal rate and regular rhythm.     Heart sounds: Normal heart sounds. No murmur heard. Pulmonary:     Effort: Pulmonary effort is normal. No respiratory distress.     Breath sounds: Normal breath sounds. No wheezing or rales.  Abdominal:     General: There is no distension.     Palpations: Abdomen is soft.     Tenderness: There is no abdominal tenderness.  Musculoskeletal:        General: No deformity.     Cervical back: Neck supple.     Right lower leg: No edema.     Left lower leg: No edema.  Lymphadenopathy:     Cervical: No cervical adenopathy.  Skin:    General: Skin is warm and dry.     Findings: No rash.  Neurological:     Mental Status: She is alert and oriented to person, place, and time. Mental status is at baseline.     Gait: Gait normal.  Psychiatric:        Mood and Affect: Mood normal.        Behavior: Behavior normal.        Thought Content: Thought content normal.      No results found for any visits on 06/07/23.  Assessment & Plan    Routine Health Maintenance and Physical Exam  Exercise Activities and Dietary recommendations  Goals   None     Immunization History  Administered Date(s) Administered   Influenza Split 01/19/2009   Influenza,inj,Quad PF,6+ Mos 12/31/2019   PFIZER(Purple Top)SARS-COV-2 Vaccination 06/25/2019, 07/16/2019, 03/25/2020   Tdap 01/19/2009, 06/05/2022   Zoster Recombinant(Shingrix) 12/31/2019, 06/05/2022    Health Maintenance  Topic Date Due   HIV Screening  Never done   Hepatitis C Screening  Never done   COVID-19 Vaccine (4 - 2024-25 season) 12/09/2022   INFLUENZA VACCINE  07/08/2023 (Originally 11/08/2022)   MAMMOGRAM  01/22/2025   Cervical Cancer Screening (HPV/Pap Cotest)  11/07/2026   Colonoscopy  07/20/2027   DTaP/Tdap/Td (3 - Td or Tdap) 06/05/2032   Zoster Vaccines- Shingrix  Completed   HPV  VACCINES  Aged Out    Discussed health benefits of physical activity, and encouraged her to engage in regular exercise appropriate for her age and condition.  Problem List Items Addressed This Visit       Other   Mixed hyperlipidemia   Elevated LDL and total cholesterol with high HDL. Triglycerides are normal. 10-year cardiovascular risk is 1.4%. Discussed dietary impact on cholesterol and the importance  of exercise. - Monitor cholesterol levels annually - Encourage dietary modifications to reduce intake of fried foods and red meats - Encourage regular exercise      Overweight   Prediabetes   A1c of 5.7% indicating prediabetes. Discussed that prediabetes is a transitional state between normal glucose levels and diabetes, emphasizing the importance of monitoring to prevent progression. - Monitor A1c levels in October 2025      Other Visit Diagnoses       Encounter for annual physical exam    -  Primary           Arthritis Significant inflammation and swelling in fingers, exacerbated by high sodium intake. Discussed the impact of sodium on swelling and the importance of reducing sodium intake and staying hydrated. - Advise reduction of sodium intake - Encourage hydration  General Health Maintenance Up to date on mammogram (November 2024), colonoscopy (April 2024, next due in 2029), Pap smear (2020, next due in 2028), and tetanus vaccination (next due in 2034). Completed shingles vaccination series. Declined flu shot for the current season. Discussed the importance of the pneumonia vaccine, recommended starting at age 39. - Consider pneumonia vaccine next year - Schedule next annual physical for February 2026.        Return in about 1 year (around 06/06/2024) for CPE.     Shirlee Latch, MD  [facility] [phone] (phone) [phone] (fax)  [facility]

## 2024-01-22 ENCOUNTER — Encounter: Payer: Self-pay | Admitting: Family Medicine

## 2024-01-27 LAB — LAB REPORT - SCANNED
A1c: 5.5
EGFR: 94
TSH: 1.12 (ref 0.41–5.90)

## 2024-02-05 ENCOUNTER — Encounter: Payer: Self-pay | Admitting: Family Medicine

## 2024-02-06 ENCOUNTER — Encounter: Payer: Self-pay | Admitting: Family Medicine

## 2024-02-06 NOTE — Telephone Encounter (Signed)
 I stated in last mychart message that we will need to see her before Rx'ing phentermine  (we haven't prescribed previously). Can see if there is any sooner appt available to offer.

## 2024-02-10 ENCOUNTER — Encounter: Payer: Self-pay | Admitting: Family Medicine

## 2024-02-10 ENCOUNTER — Ambulatory Visit (INDEPENDENT_AMBULATORY_CARE_PROVIDER_SITE_OTHER): Admitting: Family Medicine

## 2024-02-10 VITALS — BP 122/79 | HR 106 | Ht 67.0 in | Wt 164.0 lb

## 2024-02-10 DIAGNOSIS — R632 Polyphagia: Secondary | ICD-10-CM | POA: Diagnosis not present

## 2024-02-10 DIAGNOSIS — E663 Overweight: Secondary | ICD-10-CM

## 2024-02-10 DIAGNOSIS — E782 Mixed hyperlipidemia: Secondary | ICD-10-CM | POA: Diagnosis not present

## 2024-02-10 DIAGNOSIS — Z780 Asymptomatic menopausal state: Secondary | ICD-10-CM

## 2024-02-10 MED ORDER — BUPROPION HCL ER (XL) 150 MG PO TB24: 150.0000 mg | ORAL_TABLET | Freq: Every day | ORAL | 3 refills | Status: AC

## 2024-02-10 NOTE — Progress Notes (Signed)
 Established patient visit   Patient: Brenda Steele   DOB: July 27, 1965   58 y.o. Female  MRN: [id number] Visit Date: 02/10/2024  Today's healthcare provider: Jon Eva, MD   Chief Complaint  Patient presents with   Medication Refill    Patient is present to see if provider can refill phentermine    Subjective    Medication Refill   HPI     Medication Refill    Additional comments: Patient is present to see if provider can refill phentermine       Last edited by Lilian Fitzpatrick, CMA on 02/10/2024  9:57 AM.       Discussed the use of AI scribe software for clinical note transcription with the patient, who gave verbal consent to proceed.  History of Present Illness   Brenda Steele is a 58 year old female who presents with concerns regarding long-term use of phentermine  and weight management.  She has been using phentermine  for several years to manage her weight, taking it consistently but occasionally skipping doses. Despite its use, her weight remains stable. She experiences significant 'food noise,' with a constant desire to eat even when not hungry, affecting her daily activities. Phentermine  helps manage her appetite and maintain stamina.  Side effects of phentermine  include dry mouth, which she manages by drinking water. She denies heart palpitations or sleep disturbances, though she sometimes stays up late due to personal habits.  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 She typically eats a good breakfast but may skip lunch, opting for snacks like grapes, apples, or peanut butter-filled pretzels, sometimes leading to less healthy choices later. She takes calcium with vitamin D  and an additional vitamin D  supplement. For heartburn, she uses Zantac 360 (Pepcid) daily. She is aware of increasing cholesterol levels, with high good cholesterol.         Medications: Outpatient Medications Prior to Visit   Medication Sig   Ascorbic Acid (VITAMIN C) 1000 MG tablet Take 1,000 mg by mouth daily.   CALCIUM PO Take 600 mg by mouth daily.   cholecalciferol (VITAMIN D3) 25 MCG (1000 UNIT) tablet Take 1,000 Units by mouth daily.   famotidine (PEPCID) 20 MG tablet Take 20 mg by mouth 2 (two) times daily.   phentermine  (ADIPEX-P ) 37.5 MG tablet Take 37.5 mg by mouth every morning.   [DISCONTINUED] raNITIdine HCl (ZANTAC PO) Take by mouth.   [DISCONTINUED] neomycin-polymyxin b-dexamethasone (MAXITROL) 3.5-10000-0.1 SUSP Place 1 drop into the right eye 4 (four) times daily. (Patient not taking: Reported on 02/10/2024)   [DISCONTINUED] vitamin B-12 (CYANOCOBALAMIN) 100 MCG tablet Take 100 mcg by mouth daily. (Patient not taking: Reported on 02/10/2024)   No facility-administered medications prior to visit.    Review of Systems      Objective    BP 122/79 (BP Location: Left Arm, Patient Position: Sitting, Cuff Size: Normal)   Pulse (!) 106   Ht 5' 7 (1.702 m)   Wt 164 lb (74.4 kg)   SpO2 99%   BMI 25.69 kg/m    Physical Exam Vitals reviewed.  Constitutional:      General: She is not in acute distress.    Appearance: She is well-developed.  HENT:     Head: Normocephalic and atraumatic.  Eyes:     General: No scleral icterus.    Conjunctiva/sclera: Conjunctivae normal.  Cardiovascular:     Rate and Rhythm: Normal rate and regular rhythm.  Heart sounds: Normal heart sounds.  Pulmonary:     Effort: Pulmonary effort is normal. No respiratory distress.     Breath sounds: Normal breath sounds.  Skin:    General: Skin is warm and dry.     Findings: No rash.  Neurological:     Mental Status: She is alert and oriented to person, place, and time.  Psychiatric:        Behavior: Behavior normal.      No results found for any visits on 02/10/24.  Assessment & Plan     Problem List Items Addressed This Visit       Other   Mixed hyperlipidemia   Total cholesterol is 276 mg/dL. HDL  is favorable, likely due to physical activity. Ten-year risk of heart disease and stroke is 2.3%, which is low. No diabetes or smoking history, contributing to favorable risk profile. - Continue current lifestyle and activity level to maintain favorable cholesterol levels.      Overweight - Primary   Overweight with increased appetite and cravings BMI is 25.69, indicating overweight status. Long-term phentermine  use has not resulted in weight loss. Experiences increased appetite and cravings, particularly when not hungry. Phentermine  provides stamina but causes dry mouth. Considering alternative medications to manage appetite and cravings without long-term stimulant use. - Initiated Wellbutrin 150 mg daily in the morning. - Instructed to monitor for side effects such as dry mouth, changes in sleep, and activity level. - Scheduled follow-up in three months to assess effectiveness and side effects.      Other Visit Diagnoses       Appetite increase         Postmenopausal               Postmenopausal state Postmenopausal with occasional mood changes and weight gain. Wellbutrin may help with mood changes and cravings. - Continue to monitor mood changes and weight management with Wellbutrin.       Return in about 4 months (around 06/09/2024) for as scheduled.       Jon Eva, MD  [facility] [phone] (phone) [phone] (fax)  [facility]

## 2024-02-10 NOTE — Assessment & Plan Note (Signed)
 Total cholesterol is 276 mg/dL. HDL is favorable, likely due to physical activity. Ten-year risk of heart disease and stroke is 2.3%, which is low. No diabetes or smoking history, contributing to favorable risk profile. - Continue current lifestyle and activity level to maintain favorable cholesterol levels.

## 2024-02-10 NOTE — Assessment & Plan Note (Signed)
 Overweight with increased appetite and cravings BMI is 25.69, indicating overweight status. Long-term phentermine  use has not resulted in weight loss. Experiences increased appetite and cravings, particularly when not hungry. Phentermine  provides stamina but causes dry mouth. Considering alternative medications to manage appetite and cravings without long-term stimulant use. - Initiated Wellbutrin 150 mg daily in the morning. - Instructed to monitor for side effects such as dry mouth, changes in sleep, and activity level. - Scheduled follow-up in three months to assess effectiveness and side effects.

## 2024-02-27 ENCOUNTER — Ambulatory Visit: Admitting: Family Medicine

## 2024-02-28 ENCOUNTER — Encounter: Payer: Self-pay | Admitting: Family Medicine

## 2024-02-28 NOTE — Telephone Encounter (Signed)
 Needs appointment

## 2024-03-10 ENCOUNTER — Encounter: Payer: Self-pay | Admitting: Family Medicine

## 2024-04-21 ENCOUNTER — Encounter: Payer: Self-pay | Admitting: Family Medicine

## 2024-06-08 ENCOUNTER — Encounter: Payer: 59 | Admitting: Family Medicine
